# Patient Record
Sex: Female | Born: 1965 | Race: Black or African American | Hispanic: No | Marital: Married | Smoking: Never smoker
Health system: Southern US, Community
[De-identification: ages and names within clinical notes are randomized; demographics above are authoritative.]

## PROBLEM LIST (undated history)

## (undated) DIAGNOSIS — I10 Essential (primary) hypertension: Secondary | ICD-10-CM

## (undated) HISTORY — PX: ABDOMINAL HYSTERECTOMY: SHX81

## (undated) HISTORY — PX: CHOLECYSTECTOMY: SHX55

---

## 2000-03-14 ENCOUNTER — Other Ambulatory Visit: Admission: RE | Admit: 2000-03-14 | Discharge: 2000-03-14 | Payer: Self-pay | Admitting: Family Medicine

## 2001-06-12 ENCOUNTER — Other Ambulatory Visit: Admission: RE | Admit: 2001-06-12 | Discharge: 2001-06-12 | Payer: Self-pay | Admitting: Family Medicine

## 2013-06-27 ENCOUNTER — Encounter (HOSPITAL_COMMUNITY): Payer: Self-pay | Admitting: Emergency Medicine

## 2013-06-27 ENCOUNTER — Emergency Department (INDEPENDENT_AMBULATORY_CARE_PROVIDER_SITE_OTHER)
Admission: EM | Admit: 2013-06-27 | Discharge: 2013-06-27 | Disposition: A | Discharge status: Home / Self Care | Payer: BC Managed Care – PPO | Source: Home / Self Care | Attending: Emergency Medicine | Admitting: Emergency Medicine

## 2013-06-27 DIAGNOSIS — M25569 Pain in unspecified knee: Secondary | ICD-10-CM

## 2013-06-27 MED ORDER — KETOROLAC TROMETHAMINE 60 MG/2ML IM SOLN
60.0000 mg | Freq: Once | INTRAMUSCULAR | Status: AC
  Administered 2013-06-27: 60 mg via INTRAMUSCULAR

## 2013-06-27 MED ORDER — METHYLPREDNISOLONE ACETATE 40 MG/ML IJ SUSP
80.0000 mg | Freq: Once | INTRAMUSCULAR | Status: AC
  Administered 2013-06-27: 80 mg via INTRAMUSCULAR

## 2013-06-27 MED ORDER — KETOROLAC TROMETHAMINE 60 MG/2ML IM SOLN
INTRAMUSCULAR | Status: AC
  Filled 2013-06-27: qty 2

## 2013-06-27 MED ORDER — METHYLPREDNISOLONE ACETATE 80 MG/ML IJ SUSP
INTRAMUSCULAR | Status: AC
  Filled 2013-06-27: qty 1

## 2013-06-27 MED ORDER — ETODOLAC 500 MG PO TABS: 500.0000 mg | ORAL_TABLET | Freq: Two times a day (BID) | ORAL | Status: DC

## 2013-06-27 NOTE — ED Notes (Signed)
Pt  Reports  Symptoms  Of      Pain l  Knee         X  10  Days             She  denys  Any  specefic  Injury  But     She  Has  Some  Swelling          To  The  Affected  Knee

## 2013-06-27 NOTE — ED Provider Notes (Signed)
Medical screening examination/treatment/procedure(s) were performed by non-physician practitioner and as supervising physician I was immediately available for consultation/collaboration.  Katilin Raynes, M.D.  Takiyah Bohnsack C Amandalee Lacap, MD 06/27/13 2208 

## 2013-06-27 NOTE — ED Provider Notes (Signed)
CSN: [id number]     Arrival date & time 06/27/13  1335 History   First MD Initiated Contact with Patient 06/27/13 1455     Chief Complaint  Patient presents with  . Knee Pain   (Consider location/radiation/quality/duration/timing/severity/associated sxs/prior Treatment) HPI Comments: 48 year old female presents for evaluation of left knee pain. This is been present now for about 10 days. This started the morning after having an excessive amount of physical activity, moving things out of her house. She noted the pain the next morning as soreness. This has gradually gotten worse until today when she decided to come in to be seen. The pain is in the left knee only. She feels in the anterior medial part of the left knee. She also feels some locking and feeling like the knee will give way. The pain is most severe when rising from a seated position. She denies any specific injury or any specific time when the pain started, she has noted that when she woke up. She now feels some swelling in the knee as well. No numbness in the leg distal to this. No swelling in the foot distal to this. She has a no history of knee problems. No other medical conditions.  Patient is a 48 y.o. female presenting with knee pain.  Knee Pain Associated symptoms: no fever     History reviewed. No pertinent past medical history. History reviewed. No pertinent past surgical history. History reviewed. No pertinent family history. History  Substance Use Topics  . Smoking status: Never Smoker   . Smokeless tobacco: Not on file  . Alcohol Use: No   OB History   Grav Para Term Preterm Abortions TAB SAB Ect Mult Living                 Review of Systems  Constitutional: Negative for fever and chills.  Eyes: Negative for visual disturbance.  Respiratory: Negative for cough and shortness of breath.   Cardiovascular: Negative for chest pain, palpitations and leg swelling.  Gastrointestinal: Negative for nausea, vomiting and  abdominal pain.  Endocrine: Negative for polydipsia and polyuria.  Genitourinary: Negative for dysuria, urgency and frequency.  Musculoskeletal: Positive for arthralgias (see history of present illness). Negative for myalgias.  Skin: Negative for rash.  Neurological: Negative for dizziness, weakness and light-headedness.    Allergies  Review of patient's allergies indicates no known allergies.  Home Medications   Current Outpatient Rx  Name  Route  Sig  Dispense  Refill  . etodolac (LODINE) 500 MG tablet   Oral   Take 1 tablet (500 mg total) by mouth 2 (two) times daily.   30 tablet   1    BP 189/105  Pulse 64  Temp(Src) 97.8 F (36.6 C) (Oral)  Resp 20  SpO2 98% Physical Exam  Nursing note and vitals reviewed. Constitutional: She is oriented to person, place, and time. Vital signs are normal. She appears well-developed and well-nourished. No distress.  Morbid obesity  HENT:  Head: Normocephalic and atraumatic.  Pulmonary/Chest: Effort normal. No respiratory distress.  Musculoskeletal:       Left knee: Normal.  Exam of the left knee is entirely normal, but she does have pain in the medial knee with stressing the medial collateral ligament. She also has a minimal amount of pain in the knee with attempting to test the meniscus  Neurological: She is alert and oriented to person, place, and time. She has normal strength. No sensory deficit. She exhibits normal muscle tone. Coordination normal.  Skin: Skin is warm and dry. No rash noted. She is not diaphoretic.  Psychiatric: She has a normal mood and affect. Judgment normal.    ED Course  Procedures (including critical care time) Labs Review Labs Reviewed - No data to display Imaging Review No results found.   MDM   1. Knee pain    PE normal.  Arthritis vs meniscal injury.  toradol and depomedrol given here, Rx etodolac.  F/u with ortho if not improving   Meds ordered this encounter  Medications  . ketorolac  (TORADOL) injection 60 mg    Sig:   . methylPREDNISolone acetate (DEPO-MEDROL) injection 80 mg    Sig:   . etodolac (LODINE) 500 MG tablet    Sig: Take 1 tablet (500 mg total) by mouth 2 (two) times daily.    Dispense:  30 tablet    Refill:  1    Order Specific Question:  Supervising Provider    Answer:  Clementeen Graham, Kathie Rhodes [[id number]]     Graylon Good, PA-C 06/27/13 [phone]

## 2013-06-27 NOTE — Discharge Instructions (Signed)
Knee Pain Knee pain can be a result of an injury or other medical conditions. Treatment will depend on the cause of your pain. HOME CARE  Only take medicine as told by your doctor.  Keep a healthy weight. Being overweight can make the knee hurt more.  Stretch before exercising or playing sports.  If there is constant knee pain, change the way you exercise. Ask your doctor for advice.  Make sure shoes fit well. Choose the right shoe for the sport or activity.  Protect your knees. Wear kneepads if needed.  Rest when you are tired. GET HELP RIGHT AWAY IF:   Your knee pain does not stop.  Your knee pain does not get better.  Your knee joint feels hot to the touch.  You have a fever. MAKE SURE YOU:   Understand these instructions.  Will watch this condition.  Will get help right away if you are not doing well or get worse. Document Released: 07/08/2008 Document Revised: 07/04/2011 Document Reviewed: 07/08/2008 [employer] Patient Information 2014 [employer].  

## 2014-12-05 DIAGNOSIS — M1712 Unilateral primary osteoarthritis, left knee: Secondary | ICD-10-CM | POA: Insufficient documentation

## 2014-12-15 DIAGNOSIS — M5441 Lumbago with sciatica, right side: Secondary | ICD-10-CM | POA: Insufficient documentation

## 2015-03-03 DIAGNOSIS — M792 Neuralgia and neuritis, unspecified: Secondary | ICD-10-CM | POA: Insufficient documentation

## 2015-03-14 ENCOUNTER — Emergency Department (HOSPITAL_COMMUNITY)
Admission: EM | Admit: 2015-03-14 | Discharge: 2015-03-15 | Disposition: A | Discharge status: Home / Self Care | Payer: 59 | Attending: Emergency Medicine | Admitting: Emergency Medicine

## 2015-03-14 ENCOUNTER — Encounter (HOSPITAL_COMMUNITY): Payer: Self-pay

## 2015-03-14 DIAGNOSIS — M25561 Pain in right knee: Secondary | ICD-10-CM | POA: Diagnosis not present

## 2015-03-14 DIAGNOSIS — Z79899 Other long term (current) drug therapy: Secondary | ICD-10-CM | POA: Insufficient documentation

## 2015-03-14 DIAGNOSIS — R102 Pelvic and perineal pain: Secondary | ICD-10-CM | POA: Diagnosis not present

## 2015-03-14 DIAGNOSIS — Z880 Allergy status to penicillin: Secondary | ICD-10-CM | POA: Diagnosis not present

## 2015-03-14 DIAGNOSIS — R531 Weakness: Secondary | ICD-10-CM | POA: Insufficient documentation

## 2015-03-14 DIAGNOSIS — M25552 Pain in left hip: Secondary | ICD-10-CM | POA: Diagnosis not present

## 2015-03-14 DIAGNOSIS — M25551 Pain in right hip: Secondary | ICD-10-CM | POA: Diagnosis not present

## 2015-03-14 DIAGNOSIS — M2550 Pain in unspecified joint: Secondary | ICD-10-CM

## 2015-03-14 DIAGNOSIS — I10 Essential (primary) hypertension: Secondary | ICD-10-CM | POA: Diagnosis not present

## 2015-03-14 DIAGNOSIS — M79605 Pain in left leg: Secondary | ICD-10-CM | POA: Diagnosis not present

## 2015-03-14 DIAGNOSIS — R202 Paresthesia of skin: Secondary | ICD-10-CM | POA: Diagnosis not present

## 2015-03-14 HISTORY — DX: Essential (primary) hypertension: I10

## 2015-03-14 NOTE — ED Provider Notes (Signed)
CSN: [id number]     Arrival date & time 03/14/15  2318 History  By signing my name below, I, Evon Slack, attest that this documentation has been prepared under the direction and in the presence of Zadie Rhine, MD. Electronically Signed: Evon Slack, ED Scribe. 03/15/2015. 12:26 AM.     Chief Complaint  Patient presents with  . Pelvic Pain  . Leg Pain   The history is provided by the patient. No language interpreter was used.   HPI Comments: Angelica Proctor is a 49 y.o. female who presents to the Emergency Department complaining of chronic bilateral hip pain that began in April 2016 but recently worsened today. Pt states that today this morning the pain started out as an aching pain that worsened throughout the day. She states that the pain is radiating both legs down to her toes and is worse on the right side. Pt states that she is able to ambulate without difficulty. She reports over the last 3 days she feels as if there is worsening weakness in her right leg that's gets worse when ambulating. She states that she does have some paresthesias in her great toes that's worse on her right side. Pt states that her pain began in her right knee in April that has been progressively been getting worse. Pt states that since April she has had cortisone shots, tramadol, and ibuprofen with no relief. Pt states that she has followed up with several doctors that have tried treatments for tendonitis, gout and a pinched nerve with no relief. Pt states that she has recently started taking gabapentin with no relief. She denies fever, CP,cough, abdominal pain, nausea, vomiting, numbness, bowel/bladder incontinence or saddle anesthesia. Pt denies HX of back surgeries.     Past Medical History  Diagnosis Date  . Hypertension    Past Surgical History  Procedure Laterality Date  . Cholecystectomy    . Abdominal hysterectomy     History reviewed. No pertinent family history. Social History  Substance  Use Topics  . Smoking status: Never Smoker   . Smokeless tobacco: None  . Alcohol Use: Yes     Comment: occ   OB History    No data available     Review of Systems  Constitutional: Negative for fever.  Respiratory: Negative for cough and shortness of breath.   Cardiovascular: Negative for chest pain.  Gastrointestinal: Negative for nausea, vomiting and abdominal pain.  Musculoskeletal: Positive for arthralgias. Negative for back pain and gait problem.  Neurological: Positive for weakness.  All other systems reviewed and are negative.    Allergies  Penicillins and Sulfa antibiotics  Home Medications   Prior to Admission medications   Medication Sig Start Date End Date Taking? Authorizing Provider  gabapentin (NEURONTIN) 400 MG capsule Take 400 mg by mouth 2 (two) times daily.   Yes Historical Provider, MD  etodolac (LODINE) 500 MG tablet Take 1 tablet (500 mg total) by mouth 2 (two) times daily. Patient not taking: Reported on 03/15/2015 06/27/13   Graylon Good, PA-C   BP 143/84 mmHg  Pulse 78  Temp(Src) 98.1 F (36.7 C) (Oral)  Resp 22  Ht  (1.778 m)  Wt 309 lb (140.161 kg)  BMI 44.34 kg/m2  SpO2 100%   Physical Exam  CONSTITUTIONAL: Well developed/well nourished HEAD: Normocephalic/atraumatic EYES: EOMI/PERRL ENMT: Mucous membranes moist NECK: supple no meningeal signs SPINE/BACK:entire spine nontender  CV: S1/S2 noted, no murmurs/rubs/gallops noted LUNGS: Lungs are clear to auscultation bilaterally, no apparent distress  ABDOMEN: soft, nontender, no rebound or guarding GU:no cva tenderness NEURO: Awake/alert, equal motor 5/5 strength noted with the following: hip flexion/knee flexion/extension, foot dorsi/plantar flexion, great toe extension intact bilaterally, plantar reflex appropriate (toes downgoing), no sensory deficit in any dermatome.  Equal patellar/achilles reflex noted in bilateral lower extremities.  EXTREMITIES: pulses normal, full ROM, no  deformity, tenderness with ROM of both hips, mild tenderness to right knee but no warmth or effusion noted SKIN: warm, color normal PSYCH: no abnormalities of mood noted, alert and oriented to situation   ED Course  Procedures  Medications  HYDROmorphone (DILAUDID) injection 1 mg (1 mg Intravenous Given 03/15/15 0052)    DIAGNOSTIC STUDIES: Oxygen Saturation is 100% on RA, normal by my interpretation.    COORDINATION OF CARE: 12:26 AM-Discussed treatment plan with pt at bedside and pt agreed to plan.   Pt with intermittent arthralgias for months She had no significant back pain and no neuro deficits on exam and she can ambulate She reports recent back/hip imaging within past month However she does report diffuse athralgias of unclear etiology Labs ordered as I feel she may need rheumatology workup as outpatient and given referral info Pt agreeable Short course of pain meds ordered for patient  Also - she denies any abd/pelvic pain on my evaluation  BP 133/87 mmHg  Pulse 68  Temp(Src) 98.2 F (36.8 C) (Oral)  Resp 16  Ht 5\' 10"  (1.778 m)  Wt 309 lb (140.161 kg)  BMI 44.34 kg/m2  SpO2 98%   Labs Review Labs Reviewed  CBC WITH DIFFERENTIAL/PLATELET - Abnormal; Notable for the following:    RBC 3.75 (*)    Hemoglobin 11.8 (*)    All other components within normal limits  SEDIMENTATION RATE - Abnormal; Notable for the following:    Sed Rate 45 (*)    All other components within normal limits  BASIC METABOLIC PANEL    Medications  HYDROmorphone (DILAUDID) injection 1 mg (1 mg Intravenous Given 03/15/15 0052)     MDM   Final diagnoses:  Bilateral hip pain  Arthralgia      Nursing notes including past medical history and social history reviewed and considered in documentation Labs/vital reviewed myself and considered during evaluation   I personally performed the services described in this documentation, which was scribed in my presence. The recorded  information has been reviewed and is accurate.       Zadie Rhineonald Norwood Quezada, MD 03/15/15 [phone]

## 2015-03-14 NOTE — ED Notes (Signed)
Pt reports pain in the pelvic area radiating down both legs. Pt goes to physical therapy and a spine dr for the pain. Pt states that left leg pain is new as of today and that she hasn't had it in her left leg in a long time. Pt denies numbness/tingling

## 2015-03-15 LAB — CBC WITH DIFFERENTIAL/PLATELET
BASOS ABS: 0 10*3/uL (ref 0.0–0.1)
BASOS PCT: 0 %
Eosinophils Absolute: 0.1 10*3/uL (ref 0.0–0.7)
Eosinophils Relative: 2 %
HEMATOCRIT: 36.3 % (ref 36.0–46.0)
HEMOGLOBIN: 11.8 g/dL — AB (ref 12.0–15.0)
Lymphocytes Relative: 39 %
Lymphs Abs: 2.6 10*3/uL (ref 0.7–4.0)
MCH: 31.5 pg (ref 26.0–34.0)
MCHC: 32.5 g/dL (ref 30.0–36.0)
MCV: 96.8 fL (ref 78.0–100.0)
MONOS PCT: 6 %
Monocytes Absolute: 0.4 10*3/uL (ref 0.1–1.0)
Neutro Abs: 3.6 10*3/uL (ref 1.7–7.7)
Neutrophils Relative %: 53 %
Platelets: 225 10*3/uL (ref 150–400)
RBC: 3.75 MIL/uL — ABNORMAL LOW (ref 3.87–5.11)
RDW: 13.4 % (ref 11.5–15.5)
WBC: 6.7 10*3/uL (ref 4.0–10.5)

## 2015-03-15 LAB — BASIC METABOLIC PANEL
ANION GAP: 5 (ref 5–15)
BUN: 14 mg/dL (ref 6–20)
CALCIUM: 9 mg/dL (ref 8.9–10.3)
CO2: 26 mmol/L (ref 22–32)
Chloride: 110 mmol/L (ref 101–111)
Creatinine, Ser: 0.97 mg/dL (ref 0.44–1.00)
GFR calc Af Amer: >60 mL/min (ref >60)
GFR calc non Af Amer: >60 mL/min (ref >60)
Glucose, Bld: 98 mg/dL (ref 65–99)
Potassium: 3.9 mmol/L (ref 3.5–5.1)
Sodium: 141 mmol/L (ref 135–145)

## 2015-03-15 LAB — SEDIMENTATION RATE: Sed Rate: 45 mm/hr — ABNORMAL HIGH (ref 0–22)

## 2015-03-15 MED ORDER — OXYCODONE-ACETAMINOPHEN 5-325 MG PO TABS: 1.0000 | ORAL_TABLET | Freq: Three times a day (TID) | ORAL | Status: AC | PRN

## 2015-03-15 MED ORDER — HYDROMORPHONE HCL 1 MG/ML IJ SOLN
1.0000 mg | Freq: Once | INTRAMUSCULAR | Status: AC
  Administered 2015-03-15: 1 mg via INTRAVENOUS
  Filled 2015-03-15: qty 1

## 2015-03-15 NOTE — ED Notes (Signed)
Pt verbalized understanding of d/c instructions and has no further questions. Pt stable and NAD.  

## 2015-03-15 NOTE — ED Notes (Addendum)
Pt ambulated approx. 45-50 ft without issues.

## 2015-04-29 DIAGNOSIS — M25511 Pain in right shoulder: Secondary | ICD-10-CM | POA: Insufficient documentation

## 2015-06-01 DIAGNOSIS — K5900 Constipation, unspecified: Secondary | ICD-10-CM | POA: Insufficient documentation

## 2015-06-01 DIAGNOSIS — N898 Other specified noninflammatory disorders of vagina: Secondary | ICD-10-CM | POA: Insufficient documentation

## 2015-06-01 DIAGNOSIS — M5416 Radiculopathy, lumbar region: Secondary | ICD-10-CM | POA: Insufficient documentation

## 2015-06-01 DIAGNOSIS — M25579 Pain in unspecified ankle and joints of unspecified foot: Secondary | ICD-10-CM | POA: Insufficient documentation

## 2015-06-01 DIAGNOSIS — R143 Flatulence: Secondary | ICD-10-CM | POA: Insufficient documentation

## 2015-06-01 DIAGNOSIS — Z8739 Personal history of other diseases of the musculoskeletal system and connective tissue: Secondary | ICD-10-CM | POA: Insufficient documentation

## 2015-06-01 DIAGNOSIS — L500 Allergic urticaria: Secondary | ICD-10-CM | POA: Insufficient documentation

## 2015-06-01 DIAGNOSIS — M545 Low back pain, unspecified: Secondary | ICD-10-CM | POA: Insufficient documentation

## 2015-06-01 DIAGNOSIS — R0689 Other abnormalities of breathing: Secondary | ICD-10-CM | POA: Insufficient documentation

## 2015-06-01 DIAGNOSIS — J029 Acute pharyngitis, unspecified: Secondary | ICD-10-CM | POA: Insufficient documentation

## 2015-06-01 DIAGNOSIS — IMO0001 Reserved for inherently not codable concepts without codable children: Secondary | ICD-10-CM | POA: Insufficient documentation

## 2015-06-01 DIAGNOSIS — M76811 Anterior tibial syndrome, right leg: Secondary | ICD-10-CM | POA: Insufficient documentation

## 2015-06-01 DIAGNOSIS — R42 Dizziness and giddiness: Secondary | ICD-10-CM | POA: Insufficient documentation

## 2015-06-01 DIAGNOSIS — K219 Gastro-esophageal reflux disease without esophagitis: Secondary | ICD-10-CM | POA: Insufficient documentation

## 2015-06-01 DIAGNOSIS — N760 Acute vaginitis: Secondary | ICD-10-CM

## 2015-06-01 DIAGNOSIS — K589 Irritable bowel syndrome without diarrhea: Secondary | ICD-10-CM | POA: Insufficient documentation

## 2015-06-01 DIAGNOSIS — B9689 Other specified bacterial agents as the cause of diseases classified elsewhere: Secondary | ICD-10-CM | POA: Insufficient documentation

## 2015-06-02 DIAGNOSIS — M47812 Spondylosis without myelopathy or radiculopathy, cervical region: Secondary | ICD-10-CM | POA: Insufficient documentation

## 2015-06-02 DIAGNOSIS — R1031 Right lower quadrant pain: Secondary | ICD-10-CM

## 2015-06-02 DIAGNOSIS — M5030 Other cervical disc degeneration, unspecified cervical region: Secondary | ICD-10-CM | POA: Insufficient documentation

## 2015-06-02 DIAGNOSIS — G8929 Other chronic pain: Secondary | ICD-10-CM | POA: Insufficient documentation

## 2015-06-02 DIAGNOSIS — R1900 Intra-abdominal and pelvic swelling, mass and lump, unspecified site: Secondary | ICD-10-CM | POA: Insufficient documentation

## 2015-10-19 ENCOUNTER — Emergency Department (HOSPITAL_COMMUNITY): Payer: 59

## 2015-10-19 ENCOUNTER — Encounter (HOSPITAL_COMMUNITY): Payer: Self-pay | Admitting: Emergency Medicine

## 2015-10-19 DIAGNOSIS — I10 Essential (primary) hypertension: Secondary | ICD-10-CM | POA: Insufficient documentation

## 2015-10-19 DIAGNOSIS — E041 Nontoxic single thyroid nodule: Secondary | ICD-10-CM | POA: Diagnosis not present

## 2015-10-19 DIAGNOSIS — Z79899 Other long term (current) drug therapy: Secondary | ICD-10-CM | POA: Insufficient documentation

## 2015-10-19 DIAGNOSIS — S0101XA Laceration without foreign body of scalp, initial encounter: Secondary | ICD-10-CM | POA: Diagnosis not present

## 2015-10-19 DIAGNOSIS — M542 Cervicalgia: Secondary | ICD-10-CM | POA: Diagnosis not present

## 2015-10-19 DIAGNOSIS — Y929 Unspecified place or not applicable: Secondary | ICD-10-CM | POA: Diagnosis not present

## 2015-10-19 DIAGNOSIS — W108XXA Fall (on) (from) other stairs and steps, initial encounter: Secondary | ICD-10-CM | POA: Diagnosis not present

## 2015-10-19 DIAGNOSIS — Y999 Unspecified external cause status: Secondary | ICD-10-CM | POA: Insufficient documentation

## 2015-10-19 DIAGNOSIS — Y939 Activity, unspecified: Secondary | ICD-10-CM | POA: Insufficient documentation

## 2015-10-19 DIAGNOSIS — S0990XA Unspecified injury of head, initial encounter: Secondary | ICD-10-CM | POA: Diagnosis present

## 2015-10-19 NOTE — ED Notes (Addendum)
Patient states she fell down a half flight of stairs and hit her head at the partition of a wall.  No LOC, full recall of incident.  Patient has a laceration to top of head.  She is also having neck pain.  Patient placed in philly collar in triage.

## 2015-10-20 ENCOUNTER — Emergency Department (HOSPITAL_COMMUNITY)
Admission: EM | Admit: 2015-10-20 | Discharge: 2015-10-20 | Disposition: A | Discharge status: Home / Self Care | Payer: 59 | Attending: Emergency Medicine | Admitting: Emergency Medicine

## 2015-10-20 DIAGNOSIS — M542 Cervicalgia: Secondary | ICD-10-CM

## 2015-10-20 DIAGNOSIS — W19XXXA Unspecified fall, initial encounter: Secondary | ICD-10-CM

## 2015-10-20 DIAGNOSIS — S0191XA Laceration without foreign body of unspecified part of head, initial encounter: Secondary | ICD-10-CM

## 2015-10-20 DIAGNOSIS — E041 Nontoxic single thyroid nodule: Secondary | ICD-10-CM

## 2015-10-20 MED ORDER — IBUPROFEN 800 MG PO TABS: 800.0000 mg | ORAL_TABLET | Freq: Three times a day (TID) | ORAL | Status: DC

## 2015-10-20 MED ORDER — IBUPROFEN 800 MG PO TABS
800.0000 mg | ORAL_TABLET | Freq: Once | ORAL | Status: AC
  Administered 2015-10-20: 800 mg via ORAL
  Filled 2015-10-20: qty 1

## 2015-10-20 MED ORDER — LIDOCAINE-EPINEPHRINE (PF) 2 %-1:200000 IJ SOLN
10.0000 mL | Freq: Once | INTRAMUSCULAR | Status: AC
  Administered 2015-10-20: 10 mL

## 2015-10-20 MED ORDER — HYDROCODONE-ACETAMINOPHEN 5-325 MG PO TABS: 1.0000 | ORAL_TABLET | ORAL | Status: AC | PRN

## 2015-10-20 MED ORDER — OXYCODONE-ACETAMINOPHEN 5-325 MG PO TABS
1.0000 | ORAL_TABLET | Freq: Once | ORAL | Status: AC
  Administered 2015-10-20: 1 via ORAL
  Filled 2015-10-20: qty 1

## 2015-10-20 MED ORDER — ONDANSETRON 4 MG PO TBDP
8.0000 mg | ORAL_TABLET | Freq: Once | ORAL | Status: AC
  Administered 2015-10-20: 8 mg via ORAL
  Filled 2015-10-20: qty 2

## 2015-10-20 MED ORDER — LIDOCAINE HCL (PF) 1 % IJ SOLN: 10.0000 mL | Freq: Once | INTRAMUSCULAR | Status: DC

## 2015-10-20 NOTE — ED Notes (Signed)
c[facility]collar [facility]PA. [facility] alert, NAD, calm, interactive, steady on feet.

## 2015-10-20 NOTE — ED Notes (Addendum)
This EMT changed pt gauze on pts head wound, Pts family becoming very agitated at this time, Pts wound at this time is not actively bleeding.

## 2015-10-20 NOTE — ED Provider Notes (Signed)
CSN: [id number]     Arrival date & time 10/19/15  2017 History   First MD Initiated Contact with Patient 10/20/15 0056     Chief Complaint  Patient presents with  . Fall     (Consider location/radiation/quality/duration/timing/severity/associated sxs/prior Treatment) HPI]    PCP: No PCP Per Patient Angelica Proctor female50 y.o. Patient to the ER byt private care after accidentally falling down a half flight of stairs this evening. She fell down after her foot slipped and his her head on a corner of the wall. She did not have any loc. She does have neck pain, head pain and reports bruising to bilateral hips.  ROS: The patient denies confusion, diaphoresis, abdominal pains, N/V/D, gas, dysuria, abnormal  bleeding, genital discharge, fever, headache, weakness (general or focal), confusion, change of vision,  dysphagia, aphagia, shortness of breath, lower extremity swelling, rash,  chest pain, shortness of breath,  back pain.   Past Medical History  Diagnosis Date  . Hypertension    Past Surgical History  Procedure Laterality Date  . Cholecystectomy    . Abdominal hysterectomy     No family history on file. Social History  Substance Use Topics  . Smoking status: Never Smoker   . Smokeless tobacco: None  . Alcohol Use: Yes     Comment: occ   OB History    No data available     Review of Systems  Review of Systems All other systems negative except as documented in the HPI. All pertinent positives and negatives as reviewed in the HPI.   Allergies  Penicillins and Sulfa antibiotics  Home Medications   Prior to Admission medications   Medication Sig Start Date End Date Taking? Authorizing Provider  gabapentin (NEURONTIN) 400 MG capsule Take 400 mg by mouth 2 (two) times daily.    Historical Provider, MD  HYDROcodone-acetaminophen (NORCO/VICODIN) 5-325 MG tablet Take 1 tablet by mouth every 4 (four) hours as needed. 10/20/15   Bonnye Halle Neva Seat, PA-C  ibuprofen (ADVIL,MOTRIN)  800 MG tablet Take 1 tablet (800 mg total) by mouth 3 (three) times daily. 10/20/15   Marlon Pel, PA-C  oxyCODONE-acetaminophen (PERCOCET/ROXICET) 5-325 MG tablet Take 1 tablet by mouth every 8 (eight) hours as needed for severe pain. 03/15/15   Zadie Rhine, MD   BP 156/87 mmHg  Pulse 62  Temp(Src) 98.9 F (37.2 C) (Oral)  Resp 16  SpO2 100% Physical Exam  Constitutional: She appears well-developed and well-nourished. No distress.  HENT:  Head: Normocephalic. Head is with contusion and with laceration.    Right Ear: Tympanic membrane normal.  Left Ear: Tympanic membrane normal.  Nose: Nose normal.  Eyes: Pupils are equal, round, and reactive to light.  Neck: Normal range of motion. Neck supple. Muscular tenderness present. No spinous process tenderness present. No rigidity. Normal range of motion present.  Cardiovascular: Normal rate and regular rhythm.   Pulmonary/Chest: Effort normal.  Abdominal: Soft.  Musculoskeletal:  Small contusions over bilateral gluts. Symmetrical and physiologic strength to bilateral lower extremities.  Neurosensory function adequate to both legs Skin color is normal. Skin is warm and moist.  No step off deformity appreciated and no midline bony tenderness.  Ambulatory  No crepitus, laceration, effusion, induration, lesions Pedal pulses are symmetrical and palpable bilaterally   No tenderness to palpation of back or hips.  Neurological: She is alert.  Cranial nerves grossly intact on exam. Pt alert and oriented x 3 Upper and lower extremity strength is symmetrical and physiologic Normal muscular tone No  facial droop  Skin: Skin is warm and dry.  Nursing note and vitals reviewed.   ED Course  Procedures (including critical care time) Labs Review Labs Reviewed - No data to display  Imaging Review Ct Head Wo Contrast  10/19/2015  CLINICAL DATA:  Larey SeatFell down steps this afternoon, striking head. EXAM: CT HEAD WITHOUT CONTRAST CT CERVICAL  SPINE WITHOUT CONTRAST TECHNIQUE: Multidetector CT imaging of the head and cervical spine was performed following the standard protocol without intravenous contrast. Multiplanar CT image reconstructions of the cervical spine were also generated. COMPARISON:  None. FINDINGS: CT HEAD FINDINGS There is no intracranial hemorrhage, mass or evidence of acute infarction. There is no extra-axial fluid collection. Gray matter and white matter appear normal. Cerebral volume is normal for age. Brainstem and posterior fossa are unremarkable. The CSF spaces appear normal. The bony structures are intact. The visible portions of the paranasal sinuses are clear. The orbits are unremarkable. CT CERVICAL SPINE FINDINGS There is a 14 x 17 mm left lobe thyroid nodule. The vertebral column, pedicles and facet articulations are intact. There is no evidence of acute fracture. No acute soft tissue abnormalities are evident.No significant arthritic changes are evident. IMPRESSION: 1. Negative for acute intracranial traumatic injury.  Normal brain. 2. Negative for acute cervical spine fracture. 3. Incidental findings include a 17 mm left lobe thyroid nodule. Recommend nonemergent thyroid ultrasound for characterization. Electronically Signed   By: Ellery Plunkaniel R Mitchell M.D.   On: 10/19/2015 22:48   Ct Cervical Spine Wo Contrast  10/19/2015  CLINICAL DATA:  Larey SeatFell down steps this afternoon, striking head. EXAM: CT HEAD WITHOUT CONTRAST CT CERVICAL SPINE WITHOUT CONTRAST TECHNIQUE: Multidetector CT imaging of the head and cervical spine was performed following the standard protocol without intravenous contrast. Multiplanar CT image reconstructions of the cervical spine were also generated. COMPARISON:  None. FINDINGS: CT HEAD FINDINGS There is no intracranial hemorrhage, mass or evidence of acute infarction. There is no extra-axial fluid collection. Gray matter and white matter appear normal. Cerebral volume is normal for age. Brainstem and  posterior fossa are unremarkable. The CSF spaces appear normal. The bony structures are intact. The visible portions of the paranasal sinuses are clear. The orbits are unremarkable. CT CERVICAL SPINE FINDINGS There is a 14 x 17 mm left lobe thyroid nodule. The vertebral column, pedicles and facet articulations are intact. There is no evidence of acute fracture. No acute soft tissue abnormalities are evident.No significant arthritic changes are evident. IMPRESSION: 1. Negative for acute intracranial traumatic injury.  Normal brain. 2. Negative for acute cervical spine fracture. 3. Incidental findings include a 17 mm left lobe thyroid nodule. Recommend nonemergent thyroid ultrasound for characterization. Electronically Signed   By: Ellery Plunkaniel R Mitchell M.D.   On: 10/19/2015 22:48   I have personally reviewed and evaluated these images and lab results as part of my medical decision-making.   EKG Interpretation None      MDM   Final diagnoses:  Fall, initial encounter  Thyroid nodule  Laceration of head, initial encounter  Neck pain    Incidental finding of small thyroid nodule, will need nonemergent US.  CT head and neck unremarkable.  LACERATION REPAIR Performed by: Dorthula MatasGREENE,Tennille Montelongo G Authorized by: Dorthula MatasGREENE,Chrishon Martino G Consent: Verbal consent obtained. Risks and benefits: risks, benefits and alternatives were discussed Consent given by: patient Patient identity confirmed: provided demographic data Prepped and Draped in normal sterile fashion Wound explored  Laceration Location: scalp  Laceration Length: 2 inches  No Foreign Bodies seen or palpated  Anesthesia: local infiltration  Local anesthetic: lidocaine 2% with epinephrine  Anesthetic total: 3 ml  Irrigation method: syringe Amount of cleaning: standard  Skin closure: staples  Number of sutures: 6  Patient tolerance: Patient tolerated the procedure well with no immediate complications.   Tetanus UTD 2013. Laceration  occurred < 12 hours prior to repair. Discussed laceration care with pt and answered questions. Pt to f-u for staple removal in 7-10 days and wound check sooner should there be signs of dehiscence or infection. Pt is hemodynamically stable with no complaints prior to dc.     Marlon Peliffany Damier Disano, PA-C 10/20/15 0214  Azalia BilisKevin Campos, MD 10/20/15 [phone]

## 2015-10-20 NOTE — Discharge Instructions (Signed)
Facial Laceration ° A facial laceration is a cut on the face. These injuries can be painful and cause bleeding. Lacerations usually heal quickly, but they need special care to reduce scarring. °DIAGNOSIS  °Your health care provider will take a medical history, ask for details about how the injury occurred, and examine the wound to determine how deep the cut is. °TREATMENT  °Some facial lacerations may not require closure. Others may not be able to be closed because of an increased risk of infection. The risk of infection and the chance for successful closure will depend on various factors, including the amount of time since the injury occurred. °The wound may be cleaned to help prevent infection. If closure is appropriate, pain medicines may be given if needed. Your health care provider will use stitches (sutures), wound glue (adhesive), or skin adhesive strips to repair the laceration. These tools bring the skin edges together to allow for faster healing and a better cosmetic outcome. If needed, you may also be given a tetanus shot. °HOME CARE INSTRUCTIONS °· Only take over-the-counter or prescription medicines as directed by your health care provider. °· Follow your health care provider's instructions for wound care. These instructions will vary depending on the technique used for closing the wound. °For Sutures: °· Keep the wound clean and dry.   °· If you were given a bandage (dressing), you should change it at least once a day. Also change the dressing if it becomes wet or dirty, or as directed by your health care provider.   °· Wash the wound with soap and water 2 times a day. Rinse the wound off with water to remove all soap. Pat the wound dry with a clean towel.   °· After cleaning, apply a thin layer of the antibiotic ointment recommended by your health care provider. This will help prevent infection and keep the dressing from sticking.   °· You may shower as usual after the first 24 hours. Do not soak the  wound in water until the sutures are removed.   °· Get your sutures removed as directed by your health care provider. With facial lacerations, sutures should usually be taken out after 4-5 days to avoid stitch marks.   °· Wait a few days after your sutures are removed before applying any makeup. °For Skin Adhesive Strips: °· Keep the wound clean and dry.   °· Do not get the skin adhesive strips wet. You may bathe carefully, using caution to keep the wound dry.   °· If the wound gets wet, pat it dry with a clean towel.   °· Skin adhesive strips will fall off on their own. You may trim the strips as the wound heals. Do not remove skin adhesive strips that are still stuck to the wound. They will fall off in time.   °For Wound Adhesive: °· You may briefly wet your wound in the shower or bath. Do not soak or scrub the wound. Do not swim. Avoid periods of heavy sweating until the skin adhesive has fallen off on its own. After showering or bathing, gently pat the wound dry with a clean towel.   °· Do not apply liquid medicine, cream medicine, ointment medicine, or makeup to your wound while the skin adhesive is in place. This may loosen the film before your wound is healed.   °· If a dressing is placed over the wound, be careful not to apply tape directly over the skin adhesive. This may cause the adhesive to be pulled off before the wound is healed.   °· Avoid   prolonged exposure to sunlight or tanning lamps while the skin adhesive is in place. °· The skin adhesive will usually remain in place for 5-10 days, then naturally fall off the skin. Do not pick at the adhesive film.   °After Healing: °Once the wound has healed, cover the wound with sunscreen during the day for 1 full year. This can help minimize scarring. Exposure to ultraviolet light in the first year will darken the scar. It can take 1-2 years for the scar to lose its redness and to heal completely.  °SEEK MEDICAL CARE IF: °· You have a fever. °SEEK IMMEDIATE  MEDICAL CARE IF: °· You have redness, pain, or swelling around the wound.   °· You see a yellowish-white fluid (pus) coming from the wound.   °  °This information is not intended to replace advice given to you by your health care provider. Make sure you discuss any questions you have with your health care provider. °  °Document Released: 05/19/2004 Document Revised: 05/02/2014 Document Reviewed: 11/22/2012 °[employer]vier Interactive Patient Education ©2016 [employer] ° °Facial or Scalp Contusion °A facial or scalp contusion is a deep bruise on the face or head. Injuries to the face and head generally cause a lot of swelling, especially around the eyes. Contusions are the result of an injury that caused bleeding under the skin. The contusion may turn blue, purple, or yellow. Minor injuries will give you a painless contusion, but more severe contusions may stay painful and swollen for a few weeks.  °CAUSES  °A facial or scalp contusion is caused by a blunt injury or trauma to the face or head area.  °SIGNS AND SYMPTOMS  °· Swelling of the injured area.   °· Discoloration of the injured area.   °· Tenderness, soreness, or pain in the injured area.   °DIAGNOSIS  °The diagnosis can be made by taking a medical history and doing a physical exam. An X-ray exam, CT scan, or MRI may be needed to determine if there are any associated injuries, such as broken bones (fractures). °TREATMENT  °Often, the best treatment for a facial or scalp contusion is applying cold compresses to the injured area. Over-the-counter medicines may also be recommended for pain control.  °HOME CARE INSTRUCTIONS  °· Only take over-the-counter or prescription medicines as directed by your health care provider.   °· Apply ice to the injured area.   °¨ Put ice in a plastic bag.   °¨ Place a towel between your skin and the bag.   °¨ Leave the ice on for 20 minutes, 2-3 times a day.   °SEEK MEDICAL CARE IF: °· You have bite problems.   °· You have pain with  chewing.   °· You are concerned about facial defects. °SEEK IMMEDIATE MEDICAL CARE IF: °· You have severe pain or a headache that is not relieved by medicine.   °· You have unusual sleepiness, confusion, or personality changes.   °· You throw up (vomit).   °· You have a persistent nosebleed.   °· You have double vision or blurred vision.   °· You have fluid drainage from your nose or ear.   °· You have difficulty walking or using your arms or legs.   °MAKE SURE YOU:  °· Understand these instructions. °· Will watch your condition. °· Will get help right away if you are not doing well or get worse. °  °This information is not intended to replace advice given to you by your health care provider. Make sure you discuss any questions you have with your health care provider. °  °Document Released: 05/19/2004   Document Revised: 05/02/2014 Document Reviewed: 11/22/2012 °Elsevier Interactive Patient Education ©2016 [employer] ° °

## 2015-10-20 NOTE — ED Notes (Signed)
Wound closure in progress 

## 2015-10-28 DIAGNOSIS — E041 Nontoxic single thyroid nodule: Secondary | ICD-10-CM | POA: Insufficient documentation

## 2015-10-28 DIAGNOSIS — Z4802 Encounter for removal of sutures: Secondary | ICD-10-CM | POA: Insufficient documentation

## 2016-03-16 DIAGNOSIS — I10 Essential (primary) hypertension: Secondary | ICD-10-CM | POA: Insufficient documentation

## 2016-03-16 DIAGNOSIS — Z0000 Encounter for general adult medical examination without abnormal findings: Secondary | ICD-10-CM | POA: Insufficient documentation

## 2016-03-16 DIAGNOSIS — Z23 Encounter for immunization: Secondary | ICD-10-CM | POA: Insufficient documentation

## 2016-03-23 DIAGNOSIS — R252 Cramp and spasm: Secondary | ICD-10-CM | POA: Insufficient documentation

## 2016-06-18 DIAGNOSIS — M7551 Bursitis of right shoulder: Secondary | ICD-10-CM | POA: Insufficient documentation

## 2017-01-16 ENCOUNTER — Other Ambulatory Visit: Payer: Self-pay | Admitting: Orthopedic Surgery

## 2017-01-16 DIAGNOSIS — M25511 Pain in right shoulder: Secondary | ICD-10-CM

## 2017-01-27 ENCOUNTER — Ambulatory Visit
Admission: RE | Admit: 2017-01-27 | Discharge: 2017-01-27 | Disposition: A | Discharge status: Home / Self Care | Payer: 59 | Source: Ambulatory Visit | Attending: Orthopedic Surgery | Admitting: Orthopedic Surgery

## 2017-01-27 DIAGNOSIS — M25511 Pain in right shoulder: Secondary | ICD-10-CM

## 2017-02-27 ENCOUNTER — Other Ambulatory Visit: Payer: Self-pay | Admitting: Orthopedic Surgery

## 2017-02-27 DIAGNOSIS — M25511 Pain in right shoulder: Secondary | ICD-10-CM

## 2017-02-27 DIAGNOSIS — M75100 Unspecified rotator cuff tear or rupture of unspecified shoulder, not specified as traumatic: Secondary | ICD-10-CM

## 2017-03-07 ENCOUNTER — Other Ambulatory Visit: Payer: 59

## 2017-03-07 DIAGNOSIS — J302 Other seasonal allergic rhinitis: Secondary | ICD-10-CM | POA: Insufficient documentation

## 2017-03-07 DIAGNOSIS — H6983 Other specified disorders of Eustachian tube, bilateral: Secondary | ICD-10-CM | POA: Insufficient documentation

## 2017-03-20 ENCOUNTER — Other Ambulatory Visit: Payer: 59

## 2017-07-21 DIAGNOSIS — K388 Other specified diseases of appendix: Secondary | ICD-10-CM | POA: Insufficient documentation

## 2017-10-11 ENCOUNTER — Ambulatory Visit (INDEPENDENT_AMBULATORY_CARE_PROVIDER_SITE_OTHER): Payer: 59

## 2017-10-11 ENCOUNTER — Ambulatory Visit: Payer: 59

## 2017-10-11 ENCOUNTER — Other Ambulatory Visit: Payer: Self-pay | Admitting: Podiatry

## 2017-10-11 ENCOUNTER — Encounter: Payer: Self-pay | Admitting: Podiatry

## 2017-10-11 ENCOUNTER — Ambulatory Visit: Payer: 59 | Admitting: Podiatry

## 2017-10-11 VITALS — BP 121/76 | HR 87 | Resp 16 | Ht 70.0 in | Wt 304.0 lb

## 2017-10-11 DIAGNOSIS — M7751 Other enthesopathy of right foot: Secondary | ICD-10-CM | POA: Diagnosis not present

## 2017-10-11 DIAGNOSIS — M779 Enthesopathy, unspecified: Secondary | ICD-10-CM | POA: Diagnosis not present

## 2017-10-11 DIAGNOSIS — M79671 Pain in right foot: Secondary | ICD-10-CM | POA: Diagnosis not present

## 2017-10-11 DIAGNOSIS — M79672 Pain in left foot: Secondary | ICD-10-CM

## 2017-10-11 DIAGNOSIS — B351 Tinea unguium: Secondary | ICD-10-CM | POA: Diagnosis not present

## 2017-10-11 MED ORDER — TRIAMCINOLONE ACETONIDE 10 MG/ML IJ SUSP
10.0000 mg | Freq: Once | INTRAMUSCULAR | Status: AC
  Administered 2017-10-11: 10 mg

## 2017-10-11 MED ORDER — DICLOFENAC SODIUM 75 MG PO TBEC: 75.0000 mg | DELAYED_RELEASE_TABLET | Freq: Two times a day (BID) | ORAL | 2 refills | Status: DC

## 2017-10-11 MED ORDER — TERBINAFINE HCL 250 MG PO TABS: 250.0000 mg | ORAL_TABLET | Freq: Every day | ORAL | 0 refills | Status: AC

## 2017-10-11 NOTE — Progress Notes (Signed)
   Subjective:    Patient ID: Angelica Proctor, female    DOB: 02-17-1966, 52 y.o.   MRN: [id number]  HPI    Review of Systems  All other systems reviewed and are negative.      Objective:   Physical Exam        Assessment & Plan:

## 2017-10-12 NOTE — Progress Notes (Signed)
Subjective:   Patient ID: Angelica Proctor, female   DOB: 52 y.o.   MRN: [id number]   HPI Patient presents stating that she is had a lot of pain on the top of the right foot for months with no history of injury and patient states that she gets a lot of peeling of her skin on the left foot with occasional blistering and itching and she has tried moisturizing cream without relief.  Patient does not smoke and likes to be active   Review of Systems  All other systems reviewed and are negative.       Objective:  Physical Exam  Constitutional: She appears well-developed and well-nourished.  Cardiovascular: Intact distal pulses.  Pulmonary/Chest: Effort normal.  Musculoskeletal: Normal range of motion.  Neurological: She is alert.  Skin: Skin is warm.  Nursing note and vitals reviewed.   Neurovascular status intact muscle strength adequate range of motion was found to be within normal limits with patient noted to have exquisite discomfort in the dorsal extensor complex right foot with patient also noted to have moccasin-like appearance of the left foot with red irritated type tissue that also occurs within the nailbeds with thickness and brittle debris in between the toes.  Patient has good digital perfusion well oriented x3     Assessment:  Dorsal tendinitis of the extensor complex right with pain and patient also noted to have probable fungal infection of the left foot left interdigital and mild nail infection     Plan:  H&P conditions reviewed x-ray reviewed right and today I injected the dorsal tendon complex 3 mg Kenalog 5 mg Xylocaine advised on heat and ice therapy and placed on diclofenac 75 mg twice daily.  After completion she will start a 60-day regimen of Lamisil and patient just had liver function studies done last week which were normal.  Patient was explained the risk of oral medications at this time and will be seen back as symptoms indicate  X-rays indicate no signs of stress  fracture or arthritis of the right foot and ankle

## 2017-10-31 ENCOUNTER — Encounter: Payer: Self-pay | Admitting: Podiatry

## 2017-10-31 ENCOUNTER — Ambulatory Visit (INDEPENDENT_AMBULATORY_CARE_PROVIDER_SITE_OTHER): Payer: 59

## 2017-10-31 ENCOUNTER — Other Ambulatory Visit: Payer: Self-pay | Admitting: Podiatry

## 2017-10-31 ENCOUNTER — Ambulatory Visit: Payer: 59 | Admitting: Podiatry

## 2017-10-31 DIAGNOSIS — M79674 Pain in right toe(s): Secondary | ICD-10-CM

## 2017-10-31 DIAGNOSIS — S99921A Unspecified injury of right foot, initial encounter: Secondary | ICD-10-CM

## 2017-11-01 NOTE — Progress Notes (Signed)
Subjective:   Patient ID: Angelica Proctor, female   DOB: 52 y.o.   MRN: [id number]   HPI Patient states she broke her toe and was seen in another city and they want her to be followed up and it is still very sore and hard for her to wear closed in shoe   ROS      Objective:  Physical Exam  Neurovascular status intact with patient found to have edema in the fourth digit right foot with history of trauma which occurred last week with pain trying to wear shoes or when ambulating     Assessment:  Probability for fracture of the fourth digit right      Plan:  H&P x-ray reviewed and today I went ahead and advised her on continued stiff bottom shoes open toed shoes and gradual return into close shoes.  She will be seen back if symptoms persist  X-rays indicate base of fourth toe right fracture without involvement of the joint surface

## 2018-03-28 IMAGING — CT CT CERVICAL SPINE W/O CM
3 of 4 series · 14 of 33 positions shown, 17 images · non-contrast
Comparison: None.

CLINICAL DATA: Fell down steps this afternoon, striking head.

EXAM:
CT HEAD WITHOUT CONTRAST
CT CERVICAL SPINE WITHOUT CONTRAST
TECHNIQUE: Multidetector CT imaging of the head and cervical spine was
performed following the standard protocol without intravenous
contrast. Multiplanar CT image reconstructions of the cervical spine
were also generated.

[Series 3: head bone · axial · 0.43mm/px · z∈[-150,-34]mm · 6 of 76 slices shown, 8 images]
[im 9/76  soft-tissue]
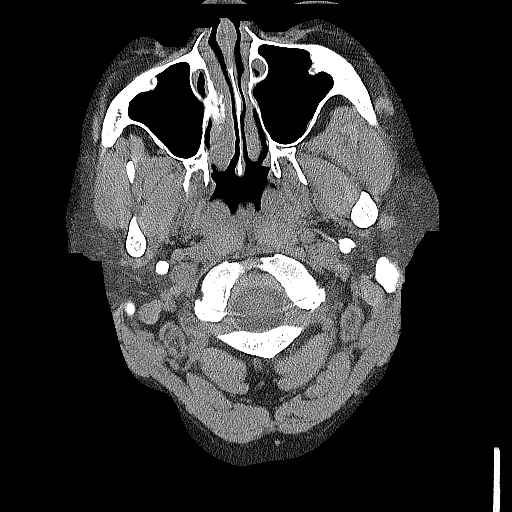
[im 9/76  bone]
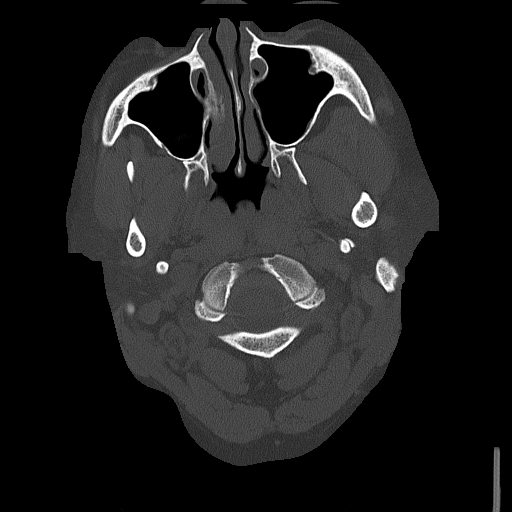
[im 26/76  bone]
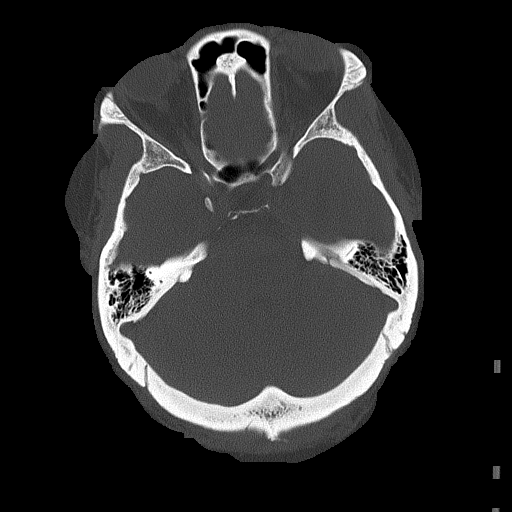
[im 34/76  bone]
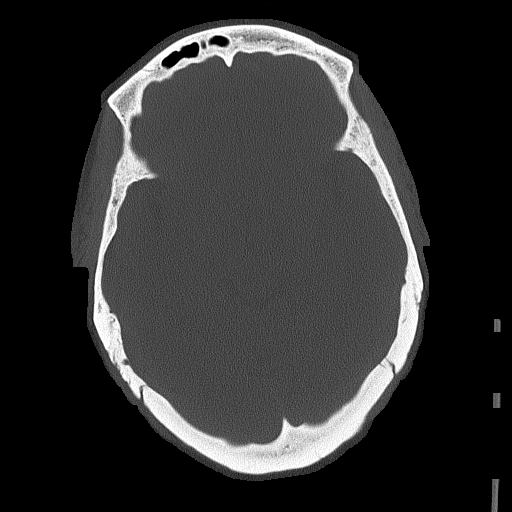
[im 42/76  bone]
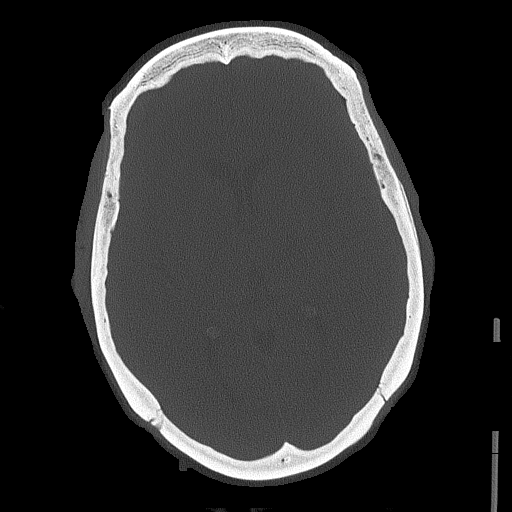
[im 59/76  soft-tissue]
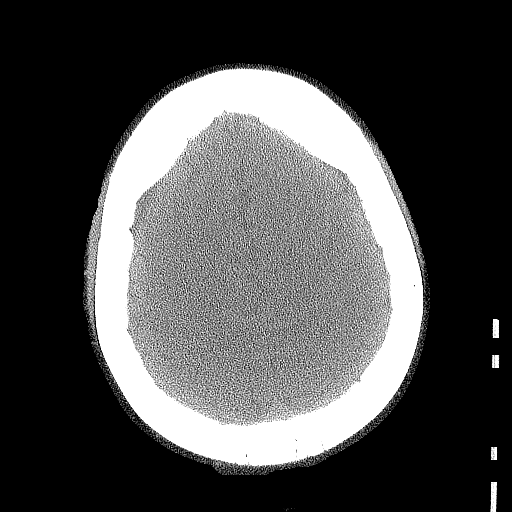
[im 59/76  bone]
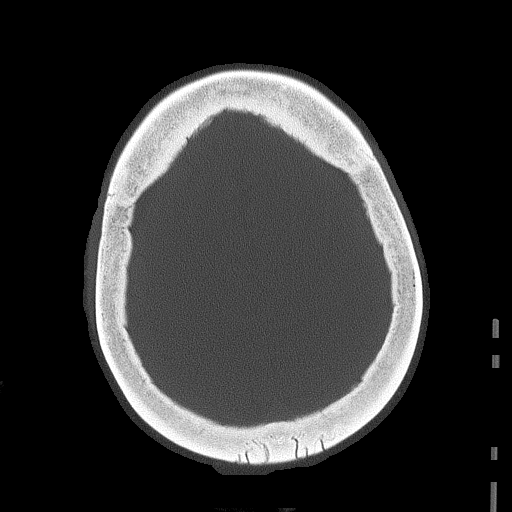
[im 67/76  bone]
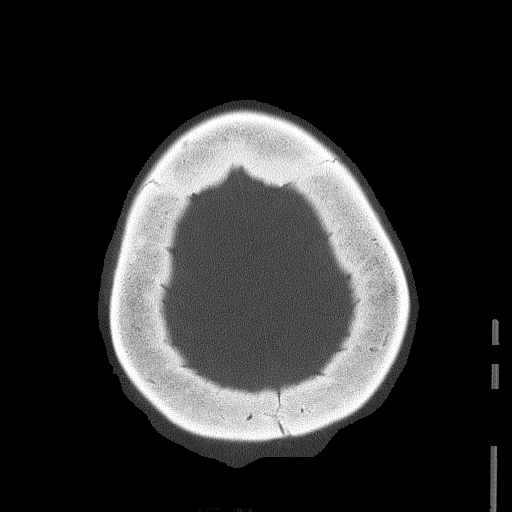

[Series 4: head without cor · coronal · non-contrast · 0.31mm/px · 3 of 72 slices shown]
[im 20/72  bone]
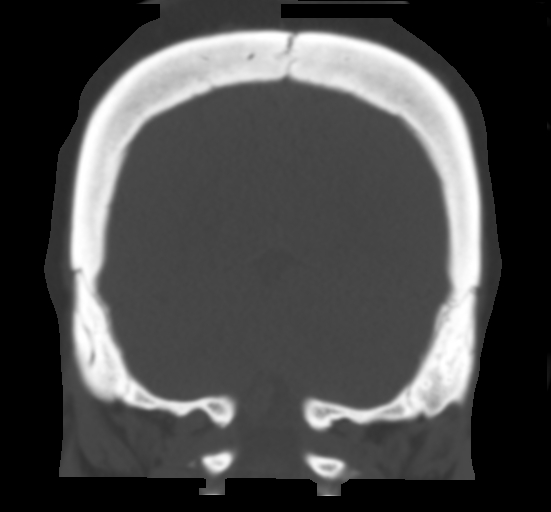
[im 31/72  bone]
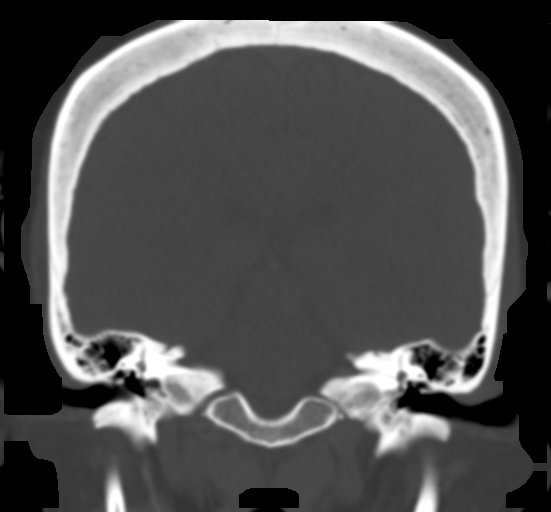
[im 41/72  bone]
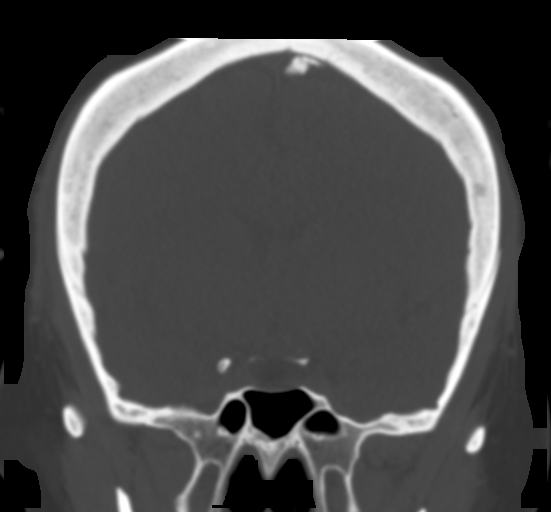

[Series 5: head without sag · sagittal · non-contrast · 0.31mm/px · 5 of 56 slices shown, 6 images]
[im 19/56  bone]
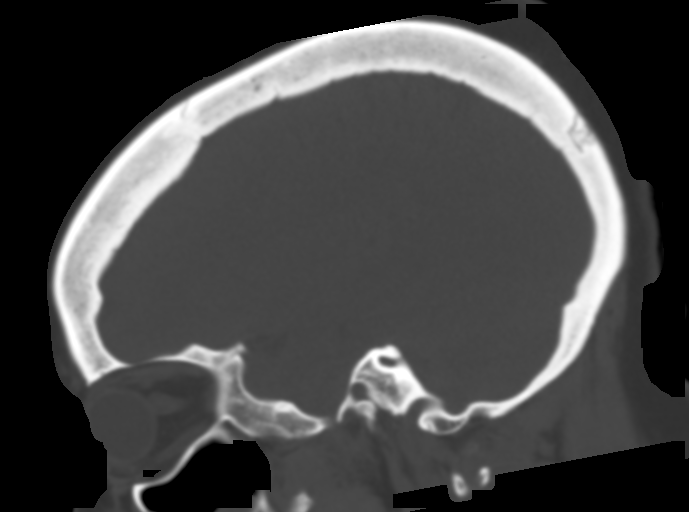
[im 23/56  bone]
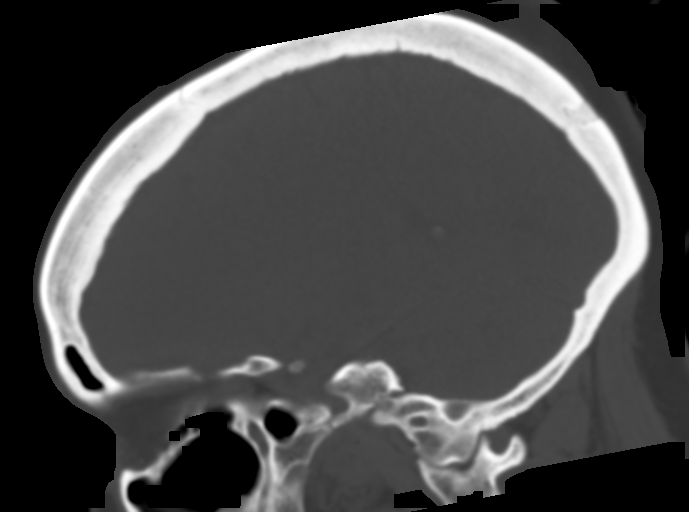
[im 28/56  soft-tissue]
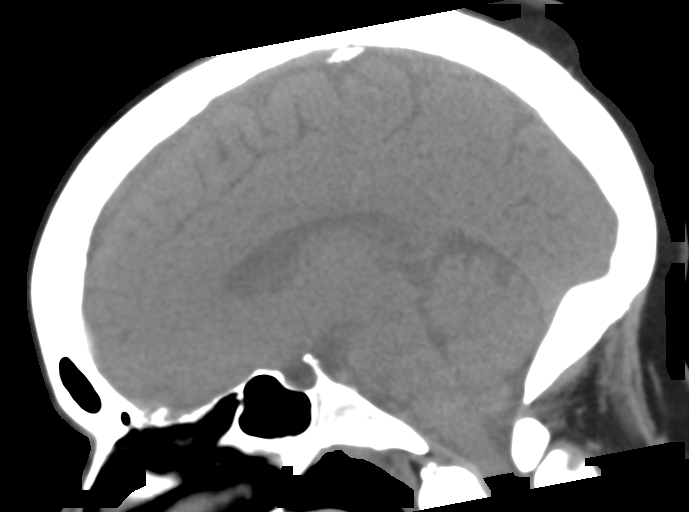
[im 28/56  bone]
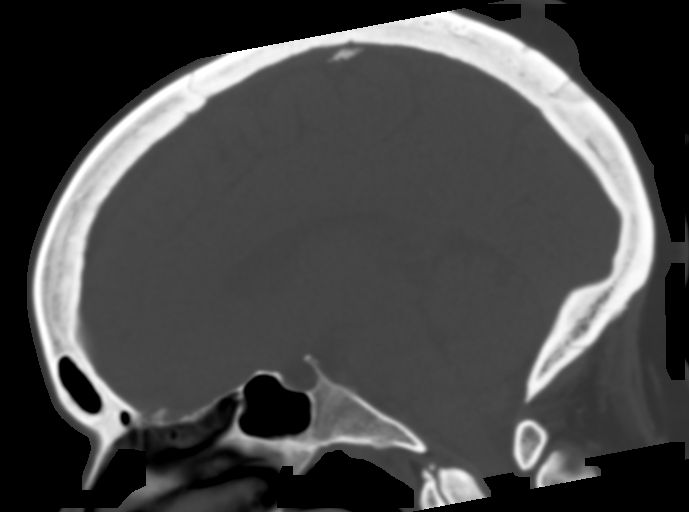
[im 33/56  bone]
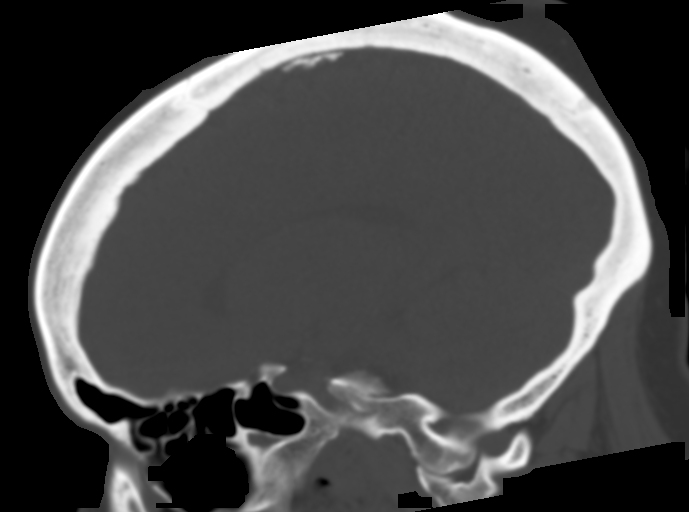
[im 37/56  bone]
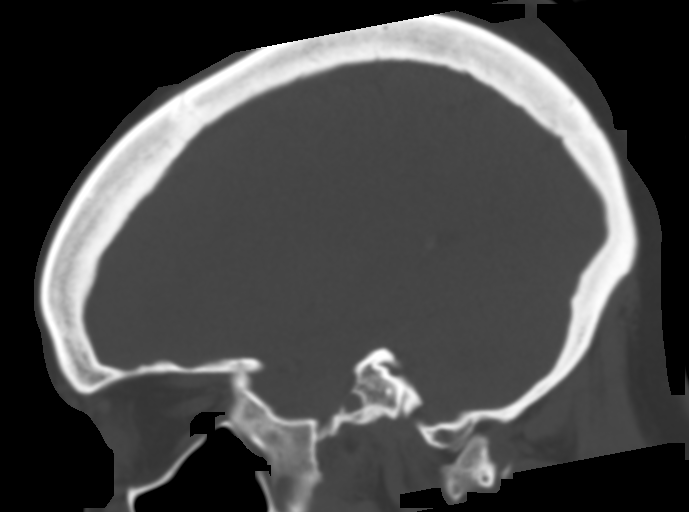

[14 of 33 positions shown; findings below may reference images not displayed]

FINDINGS: CT HEAD FINDINGS

There is no intracranial hemorrhage, mass or evidence of acute
infarction. There is no extra-axial fluid collection. Gray matter
and white matter appear normal. Cerebral volume is normal for age.
Brainstem and posterior fossa are unremarkable. The CSF spaces
appear normal.

The bony structures are intact. The visible portions of the
paranasal sinuses are clear. The orbits are unremarkable.

CT CERVICAL SPINE FINDINGS

There is a 14 x 17 mm left lobe thyroid nodule.

The vertebral column, pedicles and facet articulations are intact.
There is no evidence of acute fracture. No acute soft tissue
abnormalities are evident.No significant arthritic changes are
evident.
IMPRESSION: 1. Negative for acute intracranial traumatic injury.  Normal brain.
2. Negative for acute cervical spine fracture.
3. Incidental findings include a 17 mm left lobe thyroid nodule.
Recommend nonemergent thyroid ultrasound for characterization.

## 2018-09-21 DIAGNOSIS — M47816 Spondylosis without myelopathy or radiculopathy, lumbar region: Secondary | ICD-10-CM | POA: Insufficient documentation

## 2018-10-24 ENCOUNTER — Ambulatory Visit: Payer: 59 | Admitting: Podiatry

## 2018-10-24 ENCOUNTER — Other Ambulatory Visit: Payer: Self-pay

## 2018-10-24 ENCOUNTER — Encounter: Payer: Self-pay | Admitting: Podiatry

## 2018-10-24 ENCOUNTER — Ambulatory Visit (INDEPENDENT_AMBULATORY_CARE_PROVIDER_SITE_OTHER): Payer: 59

## 2018-10-24 ENCOUNTER — Other Ambulatory Visit: Payer: Self-pay | Admitting: Podiatry

## 2018-10-24 VITALS — Temp 97.7°F

## 2018-10-24 DIAGNOSIS — M722 Plantar fascial fibromatosis: Secondary | ICD-10-CM

## 2018-10-24 DIAGNOSIS — M79671 Pain in right foot: Secondary | ICD-10-CM

## 2018-10-24 MED ORDER — DICLOFENAC SODIUM 75 MG PO TBEC: 75.0000 mg | DELAYED_RELEASE_TABLET | Freq: Two times a day (BID) | ORAL | 2 refills | Status: DC

## 2018-10-24 NOTE — Patient Instructions (Signed)

## 2018-10-25 NOTE — Progress Notes (Signed)
Subjective:   Patient ID: Angelica Proctor, female   DOB: 53 y.o.   MRN: [id number]   HPI Patient presents with pain in the right heel stating that it is been bothering her for few months and she does not remember injury.  States that her archer also does not appear to be well supported   ROS      Objective:  Physical Exam  Neurovascular status intact muscle strength is adequate patient found to have exquisite discomfort right plantar fascia at the insertional point of the tendon into the calcaneus with fluid buildup around the medial band     Assessment:  Acute plantar fasciitis right     Plan:  H&P condition reviewed at today I did sterile prep and injected the fascia 3 mg Kenalog 5 g Xylocaine and dispense fascial brace with instructions on usage.  Patient will utilize physical therapy support shoes and be seen back to recheck  X-rays indicate there is spur formation with no indication of stress fracture arthritis signed visit

## 2018-11-07 ENCOUNTER — Ambulatory Visit: Payer: 59 | Admitting: Podiatry

## 2019-01-20 ENCOUNTER — Other Ambulatory Visit: Payer: Self-pay

## 2019-01-20 ENCOUNTER — Emergency Department (HOSPITAL_COMMUNITY): Payer: 59

## 2019-01-20 ENCOUNTER — Encounter (HOSPITAL_COMMUNITY): Payer: Self-pay

## 2019-01-20 ENCOUNTER — Emergency Department (HOSPITAL_COMMUNITY)
Admission: EM | Admit: 2019-01-20 | Discharge: 2019-01-20 | Disposition: A | Discharge status: Home / Self Care | Payer: 59 | Attending: Emergency Medicine | Admitting: Emergency Medicine

## 2019-01-20 DIAGNOSIS — I10 Essential (primary) hypertension: Secondary | ICD-10-CM | POA: Diagnosis not present

## 2019-01-20 DIAGNOSIS — U071 COVID-19: Secondary | ICD-10-CM | POA: Diagnosis not present

## 2019-01-20 DIAGNOSIS — Z79899 Other long term (current) drug therapy: Secondary | ICD-10-CM | POA: Diagnosis not present

## 2019-01-20 DIAGNOSIS — R0602 Shortness of breath: Secondary | ICD-10-CM | POA: Diagnosis present

## 2019-01-20 LAB — BASIC METABOLIC PANEL
Anion gap: 10 (ref 5–15)
BUN: 8 mg/dL (ref 6–20)
CO2: 26 mmol/L (ref 22–32)
Calcium: 8.3 mg/dL — ABNORMAL LOW (ref 8.9–10.3)
Chloride: 103 mmol/L (ref 98–111)
Creatinine, Ser: 0.79 mg/dL (ref 0.44–1.00)
GFR calc Af Amer: >60 mL/min (ref >60)
GFR calc non Af Amer: >60 mL/min (ref >60)
Glucose, Bld: 93 mg/dL (ref 70–99)
Potassium: 3.1 mmol/L — ABNORMAL LOW (ref 3.5–5.1)
Sodium: 139 mmol/L (ref 135–145)

## 2019-01-20 LAB — CBC WITH DIFFERENTIAL/PLATELET
Abs Immature Granulocytes: 0.05 10*3/uL (ref 0.00–0.07)
Basophils Absolute: 0 10*3/uL (ref 0.0–0.1)
Basophils Relative: 0 %
Eosinophils Absolute: 0 10*3/uL (ref 0.0–0.5)
Eosinophils Relative: 0 %
HCT: 37.8 % (ref 36.0–46.0)
Hemoglobin: 12 g/dL (ref 12.0–15.0)
Immature Granulocytes: 1 %
Lymphocytes Relative: 15 %
Lymphs Abs: 0.9 10*3/uL (ref 0.7–4.0)
MCH: 30.6 pg (ref 26.0–34.0)
MCHC: 31.7 g/dL (ref 30.0–36.0)
MCV: 96.4 fL (ref 80.0–100.0)
Monocytes Absolute: 0.4 10*3/uL (ref 0.1–1.0)
Monocytes Relative: 6 %
Neutro Abs: 4.9 10*3/uL (ref 1.7–7.7)
Neutrophils Relative %: 78 %
Platelets: 278 10*3/uL (ref 150–400)
RBC: 3.92 MIL/uL (ref 3.87–5.11)
RDW: 13.5 % (ref 11.5–15.5)
WBC: 6.3 10*3/uL (ref 4.0–10.5)
nRBC: 0 % (ref 0.0–0.2)

## 2019-01-20 LAB — SARS CORONAVIRUS 2 BY RT PCR (HOSPITAL ORDER, PERFORMED IN ~~LOC~~ HOSPITAL LAB): SARS Coronavirus 2: POSITIVE — AB

## 2019-01-20 MED ORDER — METHYLPREDNISOLONE SODIUM SUCC 125 MG IJ SOLR
125.0000 mg | Freq: Once | INTRAMUSCULAR | Status: AC
  Administered 2019-01-20: 125 mg via INTRAVENOUS
  Filled 2019-01-20: qty 2

## 2019-01-20 MED ORDER — ALBUTEROL SULFATE HFA 108 (90 BASE) MCG/ACT IN AERS
1.0000 | INHALATION_SPRAY | Freq: Once | RESPIRATORY_TRACT | Status: AC
  Administered 2019-01-20: 13:00:00 1 via RESPIRATORY_TRACT
  Filled 2019-01-20: qty 6.7

## 2019-01-20 MED ORDER — POTASSIUM CHLORIDE CRYS ER 20 MEQ PO TBCR
40.0000 meq | EXTENDED_RELEASE_TABLET | Freq: Once | ORAL | Status: AC
  Administered 2019-01-20: 40 meq via ORAL
  Filled 2019-01-20: qty 2

## 2019-01-20 MED ORDER — ACETAMINOPHEN 325 MG PO TABS
650.0000 mg | ORAL_TABLET | Freq: Once | ORAL | Status: AC
  Administered 2019-01-20: 650 mg via ORAL
  Filled 2019-01-20: qty 2

## 2019-01-20 MED ORDER — SODIUM CHLORIDE 0.9 % IV BOLUS
1000.0000 mL | Freq: Once | INTRAVENOUS | Status: AC
  Administered 2019-01-20: 1000 mL via INTRAVENOUS

## 2019-01-20 NOTE — Discharge Instructions (Addendum)
You have been seen today for shortness of breath. Please read and follow all provided instructions. Return to the emergency room for worsening condition or new concerning symptoms.    -You need to closely monitor your symptoms. If you cannot control your fever >100.4 with tylenol. -If your shortness of breath worsens and your oxygen level.   1. Medications:  -You were given albuterol inhaler today. You can use this if you feel short of breath.  -There is no antibiotic for Covid because it is a virus. -You can take tylenol for body aches and fever. Take as prescribed. -Continue usual home medications -You can also take over the counter cough medicine if you need it.  2. Treatment: rest, drink plenty of fluids. -You need to continue to self isolate and self quarantine until you are fever free for 1 week without taking tylenol or other medicines.   3. Follow Up: Please follow up with your primary doctor in 2-5 days for discussion of your diagnoses and further evaluation after today's visit; Call today to arrange your follow up.  If you do not have a primary care doctor use the resource guide provided to find one;   ?

## 2019-01-20 NOTE — ED Triage Notes (Signed)
Pt BIB EMS from home. Pt reports SHOB since 2am this morning. Pt states that she felt like her throat was closing.Pt states with diagnosed with sinus infection on Friday and given Zpack and reports not taking dose today because she thinks she is allergic to it. Pt reports uncontrolled fever since Friday. Pt states she traveled to [facility] 2 weeks ago. Pt denies being tested for COVID.    BP 141/79 RR 20 HR 76 CBG 108 Temp 100.6 95% RA   50mg  Benadryl PO

## 2019-01-20 NOTE — ED Notes (Signed)
CRITICAL VALUE STICKER  CRITICAL VALUE: COVID POSITIVE  DATE & TIME NOTIFIED: 01/20/19 1458  MD NOTIFIED: Tamera Punt MD  TIME OF NOTIFICATION: 4401

## 2019-01-20 NOTE — ED Provider Notes (Signed)
[facility]-EMERGENCY DEPT Provider Note   CSN: [id number] Arrival date & time: 01/20/19  1223     History   Chief Complaint Chief Complaint  Patient presents with  . Shortness of Breath    HPI Angelica Proctor is a 53 y.o. female past medical history significant for hypertension, degenerative disc disease presents to emergency department today with chief complaint of shortness of breath x 1 day.  Patient reports she was diagnosed with a sinus infection x3 days ago and was prescribed a Z-Pak.  She has taken the prescription x2 days. She did not feel to be improving and when she woke up this morning at 2 AM she felt short of breath.  She thought this was an allergic reaction from the Z-Pak. She does not think she has taken a Z-Pack prescription in the past. She reports throughout the day she continued to feel short of breath and as if her throat was closing.  She also noticed a nonproductive cough that started today.  She called EMS who gave  of benadryl in route. Pt denies symptom improvement.   Patient denies fever, chills, chest pain, palpitations, abdominal pain, nausea, vomiting, urinary symptoms, diarrhea.  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  Patient did travel to [facility] on 01/04/2019.  She states she mostly stayed in her hotel room and again is not sure she was around anyone positive for COVID-19. History provided by patient with additional history obtained from chart review.       Past Medical History:  Diagnosis Date  . Hypertension     Patient Active Problem List   Diagnosis Date Noted  . Spondylosis without myelopathy or radiculopathy, lumbar region 09/21/2018  . 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 Eustachian tube dysfunction, bilateral 03/07/2017  . Seasonal allergic rhinitis 03/07/2017  . Bursitis of right shoulder 06/18/2016  . Muscle cramps 03/23/2016  . Essential  hypertension 03/16/2016  . Needs flu shot 03/16/2016  . Routine history and physical examination of adult 03/16/2016  . Encounter for removal of sutures 10/28/2015  . Thyroid nodule 10/28/2015  . Chronic RLQ pain 06/02/2015  . DDD (degenerative disc disease), cervical 06/02/2015  . Facet arthropathy, cervical 06/02/2015  . Pelvic mass in female 06/02/2015  . Allergic urticaria 06/01/2015  . Anterior tibialis tendonitis of right leg 06/01/2015  . Bacterial vaginosis 06/01/2015  . Lumbar back pain 06/01/2015  . Constipation 06/01/2015  . Difficulty breathing 06/01/2015  . Disequilibrium 06/01/2015  . Flatulence 06/01/2015  . Gastroesophageal reflux disease 06/01/2015  . History of gout 06/01/2015  . Irritable bowel syndrome 06/01/2015  . Morbid obesity (HCC) 06/01/2015  . Lumbar radiculopathy, right 06/01/2015  . Joint pain of ankle and foot 06/01/2015  . Sore throat 06/01/2015  . Strep throat/scarlet fever 06/01/2015  . Vaginal discharge 06/01/2015  . Acute pain of right shoulder 04/29/2015  . Neuropathic pain 03/03/2015  . Low back pain with right-sided sciatica 12/15/2014  . Primary osteoarthritis of left knee 12/05/2014    Past Surgical History:  Procedure Laterality Date  . ABDOMINAL HYSTERECTOMY    . CHOLECYSTECTOMY       OB History   No obstetric history on file.      Home Medications    Prior to Admission medications   Medication Sig Start Date End Date Taking? Authorizing Provider  chlorthalidone (HYGROTON) 25 MG tablet Take 1/2 tablet daily for blood pressue. 10/04/17   [provider]  clonazePAM (KLONOPIN) 0.5 MG disintegrating tablet Take by mouth. 01/19/18   [provider]  diclofenac (VOLTAREN) 75 MG EC tablet Take 1 tablet (75 mg total) by mouth 2 (two) times daily. 10/11/17   Wallene Huh, DPM  diclofenac (VOLTAREN) 75 MG EC tablet Take 1 tablet (75 mg total) by mouth 2 (two) times daily. 10/24/18   Wallene Huh, DPM  escitalopram  (LEXAPRO) 10 MG tablet Take by mouth. 10/04/17 10/04/18  [provider]  fluconazole (DIFLUCAN) 150 MG tablet Take 1 tablet once; may repeat in 3 days if needed. 10/24/16   [provider]  fluticasone (FLONASE) 50 MCG/ACT nasal spray 1 spray by Both Nostrils route daily. 07/21/17   [provider]  gabapentin (NEURONTIN) 400 MG capsule Take 400 mg by mouth 2 (two) times daily.    [provider]  HYDROcodone-acetaminophen (NORCO/VICODIN) 5-325 MG tablet Take 1 tablet by mouth every 4 (four) hours as needed. 10/20/15   Delos Haring, PA-C  ibuprofen (ADVIL,MOTRIN) 800 MG tablet Take 1 tablet (800 mg total) by mouth 3 (three) times daily. 10/20/15   Delos Haring, PA-C  oxyCODONE-acetaminophen (PERCOCET/ROXICET) 5-325 MG tablet Take 1 tablet by mouth every 8 (eight) hours as needed for severe pain. 03/15/15   Ripley Fraise, MD  phentermine 30 MG capsule Take by mouth. 08/01/18 08/01/19  [provider]  terbinafine (LAMISIL) 250 MG tablet Take 1 tablet (250 mg total) by mouth daily. 10/11/17   Wallene Huh, DPM    Family History History reviewed. No pertinent family history.  Social History Social History   Tobacco Use  . Smoking status: Never Smoker  . Smokeless tobacco: Never Used  Substance Use Topics  . Alcohol use: Yes    Comment: occ  . Drug use: No     Allergies   Penicillins and Sulfa antibiotics   Review of Systems Review of Systems  Constitutional: Negative for chills and fever.  HENT: Negative for congestion, ear discharge, ear pain, sinus pressure, sinus pain and sore throat.   Eyes: Negative for pain and redness.  Respiratory: Positive for cough and shortness of breath. Negative for chest tightness.   Cardiovascular: Negative for chest pain.  Gastrointestinal: Negative for abdominal pain, constipation, diarrhea, nausea and vomiting.  Genitourinary: Negative for dysuria and hematuria.  Musculoskeletal: Negative for back  pain and neck pain.  Skin: Negative for wound.  Neurological: Negative for weakness, numbness and headaches.     Physical Exam Updated Vital Signs BP (!) 153/74   Pulse 76   Temp 99.5 F (37.5 C) (Oral)   Resp 12   Ht 5\' 10"  (1.778 m)   Wt (!) 144.2 kg   SpO2 98%   BMI 45.63 kg/m   Physical Exam Vitals signs and nursing note reviewed.  Constitutional:      General: She is not in acute distress.    Appearance: She is not ill-appearing.     Comments: Airway is intact. No angioedema.  Pt is talking in full sentences.  HENT:     Head: Normocephalic and atraumatic.     Comments: No erythema to oropharynx, no edema, no exudate, no tonsillar swelling, voice normal, neck supple without lymphadenopathy     Right Ear: Tympanic membrane and external ear normal.     Left Ear: Tympanic membrane and external ear normal.     Nose: Nose normal.     Mouth/Throat:     Mouth: Mucous membranes are dry.  Pharynx: Oropharynx is clear.  Eyes:     General: No scleral icterus.       Right eye: No discharge.        Left eye: No discharge.     Extraocular Movements: Extraocular movements intact.     Conjunctiva/sclera: Conjunctivae normal.     Pupils: Pupils are equal, round, and reactive to light.  Neck:     Musculoskeletal: Normal range of motion.     Vascular: No JVD.  Cardiovascular:     Rate and Rhythm: Normal rate and regular rhythm.     Pulses: Normal pulses.          Radial pulses are 2+ on the right side and 2+ on the left side.     Heart sounds: Normal heart sounds.  Pulmonary:     Comments: Lungs clear to auscultation in all fields. Symmetric chest rise. No wheezing, rales, or rhonchi. Abdominal:     Comments: Abdomen is soft, non-distended, and non-tender in all quadrants. No rigidity, no guarding. No peritoneal signs.  Musculoskeletal: Normal range of motion.  Skin:    General: Skin is warm and dry.     Capillary Refill: Capillary refill takes less than 2 seconds.   Neurological:     Mental Status: She is oriented to person, place, and time.     GCS: GCS eye subscore is 4. GCS verbal subscore is 5. GCS motor subscore is 6.     Comments: Fluent speech, no facial droop.  Psychiatric:        Behavior: Behavior normal.      ED Treatments / Results  Labs (all labs ordered are listed, but only abnormal results are displayed) Labs Reviewed  SARS CORONAVIRUS 2 (HOSPITAL ORDER, PERFORMED IN [facility]) - Abnormal; Notable for the following components:      Result Value   SARS Coronavirus 2 POSITIVE (*)    All other components within normal limits  BASIC METABOLIC PANEL - Abnormal; Notable for the following components:   Potassium 3.1 (*)    Calcium 8.3 (*)    All other components within normal limits  CBC WITH DIFFERENTIAL/PLATELET    EKG None  Radiology Dg Chest Portable 1 View  Result Date: 01/20/2019 CLINICAL DATA:  Shortness of breath since early this morning. EXAM: PORTABLE CHEST 1 VIEW COMPARISON:  None. FINDINGS: The heart size is prominent. The hilar symmetric. The mediastinum is unremarkable given portable technique. No pneumothorax. Mild increased interstitial markings in the lungs with mildly more focal opacity in the periphery of the left lung. IMPRESSION: 1. The heart appears prominent. Evaluation of heart size is limited due to portable technique. 2. Diffuse interstitial opacities in the lungs may represent pulmonary venous congestion. 3. More focal opacity in the periphery of the left lung base could represent developing infiltrate/pneumonia. A PA and lateral chest x-ray may better evaluate the chest. Electronically Signed   By: Gerome Samavid  Williams III M.D   On: 01/20/2019 14:52    Procedures Procedures (including critical care time)  Medications Ordered in ED Medications  albuterol (VENTOLIN HFA) 108 (90 Base) MCG/ACT inhaler 1 puff (1 puff Inhalation Given 01/20/19 1324)  methylPREDNISolone sodium succinate (SOLU-MEDROL)  125 mg/2 mL injection 125 mg (125 mg Intravenous Given 01/20/19 1330)  sodium chloride 0.9 % bolus 1,000 mL (1,000 mLs Intravenous New Bag/Given (Non-Interop) 01/20/19 1518)  acetaminophen (TYLENOL) tablet 650 mg (650 mg Oral Given 01/20/19 1516)  potassium chloride SA (K-DUR) CR tablet 40 mEq (40 mEq Oral  Given 01/20/19 1516)     Initial Impression / Assessment and Plan / ED Course  I have reviewed the triage vital signs and the nursing notes.  Pertinent labs & imaging results that were available during my care of the patient were reviewed by me and considered in my medical decision making (see chart for details).  Patient seen and examined. She appears not to feel well but is in no acute distress.  Nontoxic-appearing. No hypoxia.  She has low-grade temp of 99.5. She is presenting with possible allergic reaction. EMS gave benadryl, she continued to have throat tightness, IV solumedrol given in ED. Lungs are clear to auscultation in all fields with normal work of breathing.  Tylenol and IV fluids given.  Lab work today shows hypokalemia of 3.1, will replete with PO. CBC with no leukocytosis, no renal insufficiencies.  Hemoglobin is stable at 12. . Chest xray shows infiltrate with concern for pneumonia. Covid test is positive. Pt ambulated in the room without hypoxia, no distress noted.  Her vital signs and labs do not meet Sirs or sepsis criteria.  Findings and plan of care discussed with supervising physician Dr. Fredderick Phenix.  Patient care transferred to T. Kirichenko PA-C at the end of my shift pending reassesment. Patient presentation, ED course, and plan of care discussed with review of all pertinent labs and imaging. Please see her note for further details regarding further ED course and disposition. Anticipate discharge home if pt continues to have stable vitals without hypoxia, tachypnea or other symptoms that would warrant admission.   I discussed at length treatment plan and recommend symptomatic  treatment and self isolation and self quarantine.   Angelica Proctor was evaluated in Emergency Department on 01/20/2019 for the symptoms described in the history of present illness. She was evaluated in the context of the global COVID-19 pandemic, which necessitated consideration that the patient might be at risk for infection with the SARS-CoV-2 virus that causes COVID-19. Institutional protocols and algorithms that pertain to the evaluation of patients at risk for COVID-19 are in a state of rapid change based on information released by regulatory bodies including the CDC and federal and state organizations. These policies and algorithms were followed during the patient's care in the ED.   Portions of this note were generated with Scientist, clinical (histocompatibility and immunogenetics). Dictation errors may occur despite best attempts at proofreading.  Final Clinical Impressions(s) / ED Diagnoses   Final diagnoses:  COVID-19 virus detected    ED Discharge Orders    None       Sherene Sires, PA-C 01/20/19 1536    Rolan Bucco, MD 01/24/19 1514

## 2019-03-07 ENCOUNTER — Telehealth: Payer: Self-pay

## 2019-03-07 NOTE — Telephone Encounter (Signed)
Received vm from pts employer wanting to set up a work comp appt with Dr. Lorin Mercy. IC back and left vm for her to return my call.

## 2019-04-09 ENCOUNTER — Other Ambulatory Visit: Payer: Self-pay

## 2019-04-09 ENCOUNTER — Ambulatory Visit (INDEPENDENT_AMBULATORY_CARE_PROVIDER_SITE_OTHER): Payer: Worker's Compensation | Admitting: Orthopaedic Surgery

## 2019-04-09 ENCOUNTER — Encounter: Payer: Self-pay | Admitting: Orthopaedic Surgery

## 2019-04-09 VITALS — BP 170/92 | HR 73 | Ht 70.0 in | Wt 320.0 lb

## 2019-04-09 DIAGNOSIS — M7062 Trochanteric bursitis, left hip: Secondary | ICD-10-CM | POA: Diagnosis not present

## 2019-04-09 DIAGNOSIS — M47816 Spondylosis without myelopathy or radiculopathy, lumbar region: Secondary | ICD-10-CM

## 2019-04-09 MED ORDER — METHYLPREDNISOLONE ACETATE 40 MG/ML IJ SUSP
40.0000 mg | INTRAMUSCULAR | Status: AC | PRN
  Administered 2019-04-09: 10:00:00 40 mg via INTRA_ARTICULAR

## 2019-04-09 MED ORDER — LIDOCAINE HCL 1 % IJ SOLN
0.5000 mL | INTRAMUSCULAR | Status: AC | PRN
  Administered 2019-04-09: 10:00:00 .5 mL

## 2019-04-09 MED ORDER — BUPIVACAINE HCL 0.25 % IJ SOLN
2.0000 mL | INTRAMUSCULAR | Status: AC | PRN
  Administered 2019-04-09: 2 mL via INTRA_ARTICULAR
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
## 2019-04-17 ENCOUNTER — Other Ambulatory Visit: Payer: Self-pay

## 2019-04-17 ENCOUNTER — Encounter: Payer: Self-pay | Admitting: Orthopaedic Surgery

## 2019-04-17 ENCOUNTER — Ambulatory Visit (INDEPENDENT_AMBULATORY_CARE_PROVIDER_SITE_OTHER): Payer: Worker's Compensation | Admitting: Orthopaedic Surgery

## 2019-04-17 VITALS — Ht 70.0 in | Wt 320.0 lb

## 2019-04-17 DIAGNOSIS — M7541 Impingement syndrome of right shoulder: Secondary | ICD-10-CM | POA: Diagnosis not present

## 2019-04-17 DIAGNOSIS — M47816 Spondylosis without myelopathy or radiculopathy, lumbar region: Secondary | ICD-10-CM

## 2019-04-17 MED ORDER — BUPIVACAINE HCL 0.25 % IJ SOLN
4.0000 mL | INTRAMUSCULAR | Status: AC | PRN
  Administered 2019-04-17: 10:00:00 4 mL via INTRA_ARTICULAR

## 2019-04-17 MED ORDER — METHYLPREDNISOLONE ACETATE 40 MG/ML IJ SUSP
40.0000 mg | INTRAMUSCULAR | Status: AC | PRN
  Administered 2019-04-17: 40 mg via INTRA_ARTICULAR

## 2019-04-17 MED ORDER — LIDOCAINE HCL 1 % IJ SOLN
0.5000 mL | INTRAMUSCULAR | Status: AC | PRN
  Administered 2019-04-17: .5 mL
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
## 2019-05-21 ENCOUNTER — Ambulatory Visit: Payer: 59 | Admitting: Orthopaedic Surgery

## 2019-05-22 ENCOUNTER — Other Ambulatory Visit: Payer: Self-pay

## 2019-05-22 ENCOUNTER — Encounter: Payer: Self-pay | Admitting: Orthopaedic Surgery

## 2019-05-22 ENCOUNTER — Ambulatory Visit (INDEPENDENT_AMBULATORY_CARE_PROVIDER_SITE_OTHER): Payer: Worker's Compensation | Admitting: Orthopaedic Surgery

## 2019-05-22 VITALS — Ht 70.0 in | Wt 320.0 lb

## 2019-05-22 DIAGNOSIS — M7541 Impingement syndrome of right shoulder: Secondary | ICD-10-CM

## 2019-05-22 MED ORDER — NAPROXEN 500 MG PO TABS: 500.0000 mg | ORAL_TABLET | Freq: Two times a day (BID) | ORAL | 2 refills | Status: DC

## 2019-05-22 MED ORDER — NAPROXEN 500 MG PO TABS: 500.0000 mg | ORAL_TABLET | Freq: Two times a day (BID) | ORAL | 2 refills | Status: AC
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
## 2019-05-23 ENCOUNTER — Telehealth: Payer: Self-pay | Admitting: Orthopaedic Surgery

## 2019-05-23 DIAGNOSIS — M7541 Impingement syndrome of right shoulder: Secondary | ICD-10-CM

## 2019-05-23 NOTE — Telephone Encounter (Signed)
Patient called advised her appointment is set up for (PT) tomorrow morning at 7:00am. Patient advised she is going to ([facility] on [address]. The phone number there is [phone]. Patient asked if she can get a doctor's note stating when she can return to work. The number to contact patient is [phone]

## 2019-05-23 NOTE — Telephone Encounter (Signed)
Patient called.   Her job informed her that her note must say she can come back to work full duty after her next appointment   Call back number: [phone]

## 2019-05-23 NOTE — Telephone Encounter (Signed)
I printed it. It's at the front, ready for pick up.

## 2019-05-23 NOTE — Telephone Encounter (Signed)
April- work note should print to [employer]. Please stamp and put at front desk for patient. Thanks.  Angelica Proctor- referral for PT entered. Can you please be sure that they get this prior to her 7am appt tomorrow morning at [facility] [address]?  She is filing her own private insurance because [employer] will not return her call. Thanks.

## 2019-05-24 ENCOUNTER — Telehealth: Payer: Self-pay

## 2019-05-24 NOTE — Telephone Encounter (Signed)
Patient said someone called her and told her that her FMLA forms had been faxed but her short term [employer] called her yesterday stating that haven't received anything from [place]

## 2019-05-24 NOTE — Telephone Encounter (Signed)
Note written, patient aware.

## 2019-05-24 NOTE — Telephone Encounter (Signed)
I wasn't here after 230pm yesterday so pt did not get RX before her appt

## 2019-05-28 ENCOUNTER — Telehealth: Payer: Self-pay | Admitting: Radiology

## 2019-05-28 NOTE — Telephone Encounter (Signed)
[employer] states they sent form to [employer] 2/1.

## 2019-05-28 NOTE — Telephone Encounter (Signed)
I called patient and advised. 

## 2019-05-28 NOTE — Telephone Encounter (Signed)
Voicemail left from patient stating [facility] did not get copy of PT rx for her shoulder. She would like for this to be refaxed.  She also states that [employer] did not receive her paperwork for disability and would like to know if this can be resent.  Patient requests call back to let her know these have been taken care of.  PT Rx has been faxed to [facility] at [facility].  Tammy-is there a way that you can check to see if patient's paperwork has been faxed to [employer]? Thanks.

## 2019-06-12 ENCOUNTER — Ambulatory Visit (INDEPENDENT_AMBULATORY_CARE_PROVIDER_SITE_OTHER): Payer: Worker's Compensation | Admitting: Orthopaedic Surgery

## 2019-06-12 ENCOUNTER — Encounter: Payer: Self-pay | Admitting: Orthopaedic Surgery

## 2019-06-12 ENCOUNTER — Other Ambulatory Visit: Payer: Self-pay

## 2019-06-12 VITALS — Ht 70.0 in | Wt 320.0 lb

## 2019-06-12 DIAGNOSIS — M7541 Impingement syndrome of right shoulder: Secondary | ICD-10-CM | POA: Diagnosis not present
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
## 2019-08-07 ENCOUNTER — Ambulatory Visit: Payer: 59 | Admitting: Orthopaedic Surgery

## 2021-06-29 IMAGING — DX DG CHEST 1V PORT
1 series · 1 of 1 positions shown · non-contrast
Comparison: None.

CLINICAL DATA: Shortness of breath since early this morning.

EXAM:
PORTABLE CHEST 1 VIEW

[chest ap]
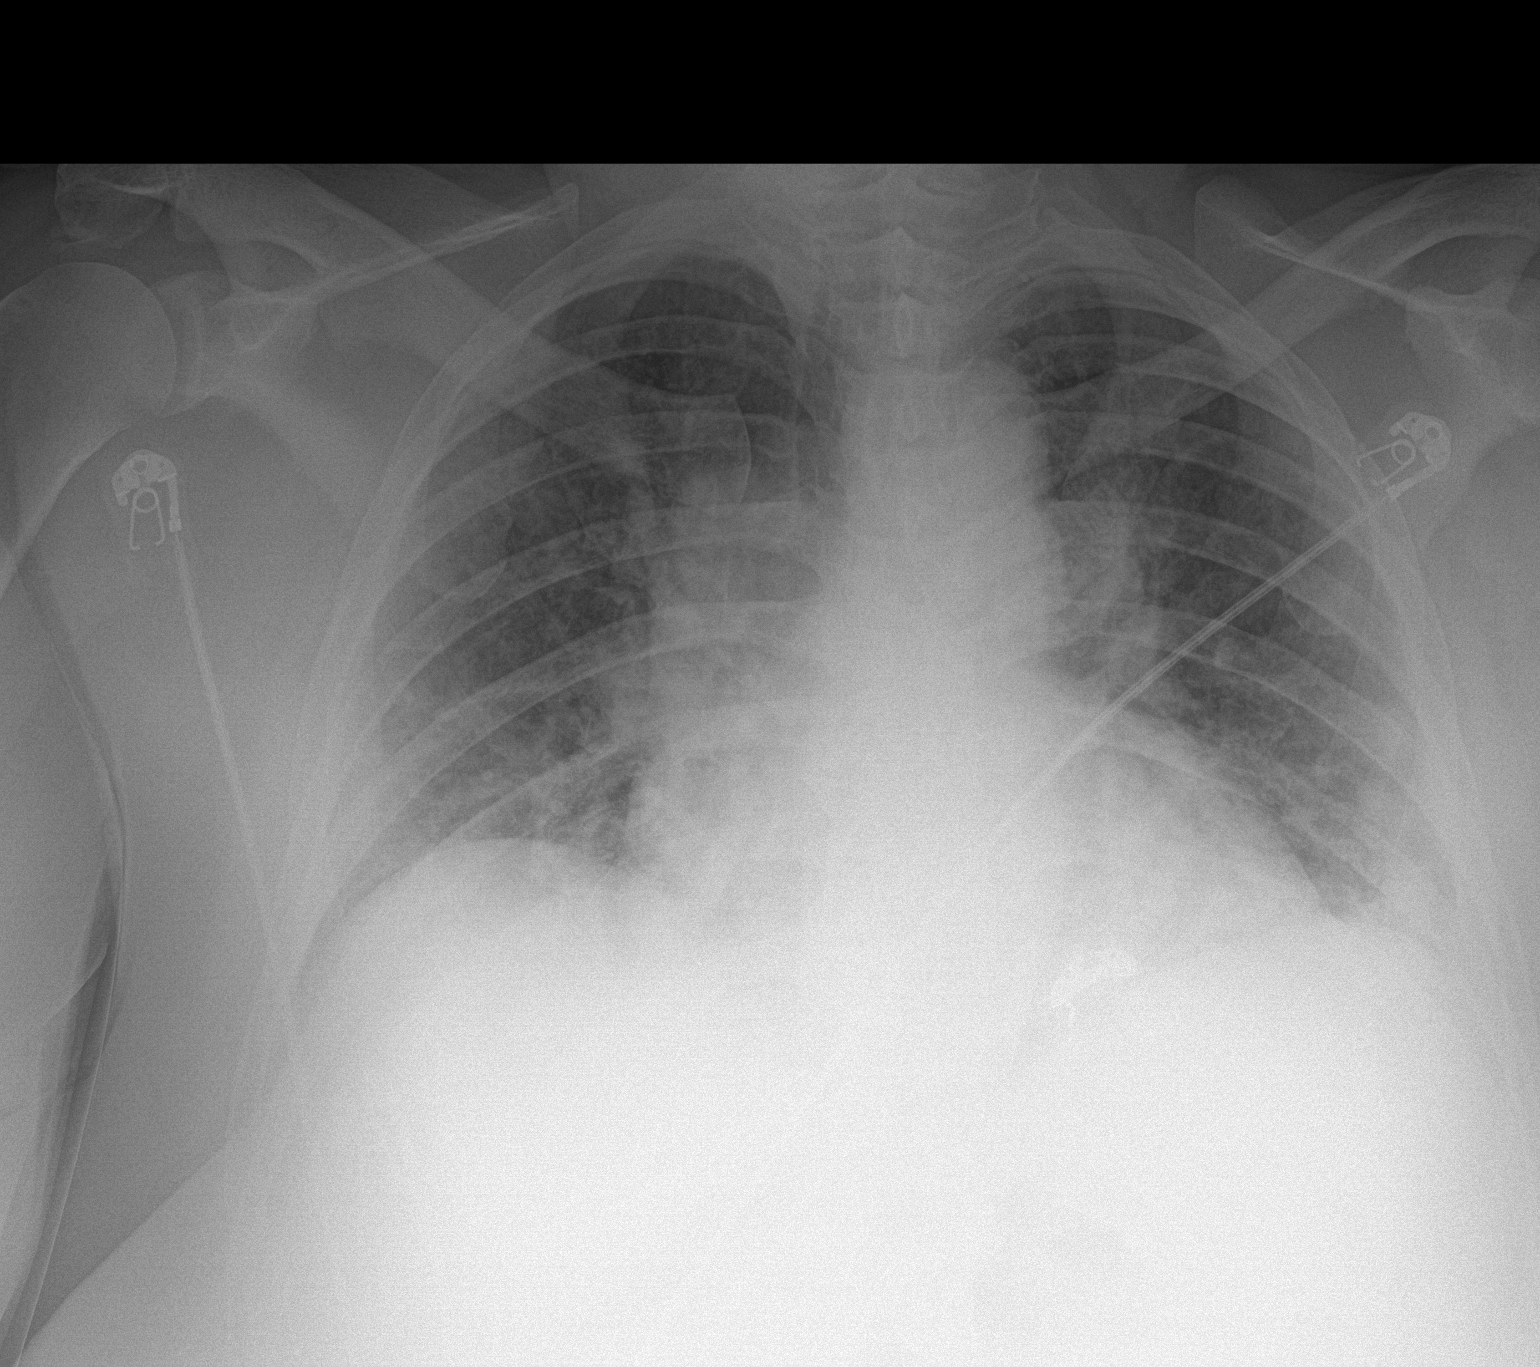

[1 of 1 positions shown; findings below may reference images not displayed]

FINDINGS: The heart size is prominent. The hilar symmetric. The mediastinum is
unremarkable given portable technique. No pneumothorax. Mild
increased interstitial markings in the lungs with mildly more focal
opacity in the periphery of the left lung.
IMPRESSION: 1. The heart appears prominent. Evaluation of heart size is limited
due to portable technique.
2. Diffuse interstitial opacities in the lungs may represent
pulmonary venous congestion.
3. More focal opacity in the periphery of the left lung base could
represent developing infiltrate/pneumonia. A PA and lateral chest
x-ray may better evaluate the chest.
